# Patient Record
Sex: Female | Born: 1995 | Hispanic: Refuse to answer | Marital: Single | Smoking: Never smoker
Health system: Southern US, Community
[De-identification: ages and names within clinical notes are randomized; demographics above are authoritative.]

## PROBLEM LIST (undated history)

## (undated) DIAGNOSIS — F419 Anxiety disorder, unspecified: Secondary | ICD-10-CM

## (undated) DIAGNOSIS — R620 Delayed milestone in childhood: Secondary | ICD-10-CM

## (undated) HISTORY — DX: Anxiety disorder, unspecified: F41.9

---
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
## 2017-09-07 ENCOUNTER — Encounter: Payer: Self-pay | Admitting: Gastroenterology

## 2017-10-06 ENCOUNTER — Encounter: Payer: Self-pay | Admitting: Gastroenterology

## 2017-10-06 ENCOUNTER — Ambulatory Visit (INDEPENDENT_AMBULATORY_CARE_PROVIDER_SITE_OTHER): Payer: Medicaid Other | Admitting: Gastroenterology

## 2017-10-06 VITALS — BP 110/72 | HR 72 | Ht 64.5 in | Wt 167.6 lb

## 2017-10-06 DIAGNOSIS — R05 Cough: Secondary | ICD-10-CM | POA: Insufficient documentation

## 2017-10-06 DIAGNOSIS — R1310 Dysphagia, unspecified: Secondary | ICD-10-CM | POA: Diagnosis not present

## 2017-10-06 DIAGNOSIS — R1030 Lower abdominal pain, unspecified: Secondary | ICD-10-CM | POA: Insufficient documentation

## 2017-10-06 DIAGNOSIS — K219 Gastro-esophageal reflux disease without esophagitis: Secondary | ICD-10-CM | POA: Diagnosis not present

## 2017-10-06 DIAGNOSIS — R053 Chronic cough: Secondary | ICD-10-CM | POA: Insufficient documentation

## 2017-10-06 DIAGNOSIS — R197 Diarrhea, unspecified: Secondary | ICD-10-CM

## 2017-10-06 MED ORDER — DICYCLOMINE HCL 20 MG PO TABS: 20.0000 mg | ORAL_TABLET | Freq: Two times a day (BID) | ORAL | 2 refills | Status: DC

## 2017-10-06 MED ORDER — NA SULFATE-K SULFATE-MG SULF 17.5-3.13-1.6 GM/177ML PO SOLN: ORAL | 0 refills | Status: DC

## 2017-10-06 MED ORDER — DEXLANSOPRAZOLE 30 MG PO CPDR: 30.0000 mg | DELAYED_RELEASE_CAPSULE | Freq: Every day | ORAL | 5 refills | Status: DC

## 2017-10-06 NOTE — Patient Instructions (Signed)
If you are age 24 or older, your body mass index should be between 23-30. Your Body mass index is 28.32 kg/m. If this is out of the aforementioned range listed, please consider follow up with your Primary Care Provider.  If you are age 24 or younger, your body mass index should be between 19-25. Your Body mass index is 28.32 kg/m. If this is out of the aformentioned range listed, please consider follow up with your Primary Care Provider.   You have been scheduled for an endoscopy and colonoscopy. Please follow the written instructions given to you at your visit today. Please pick up your prep supplies at the pharmacy within the next 1-3 days. If you use inhalers (even only as needed), please bring them with you on the day of your procedure. Your physician has requested that you go to www.startemmi.com and enter the access code given to you at your visit today. This web site gives a general overview about your procedure. However, you should still follow specific instructions given to you by our office regarding your preparation for the procedure.  We have sent the following medications to your pharmacy for you to pick up at your convenience: Suprep Dexilant Bentyl  Thank you for choosing me and [facility].   Doug SouJessica Zehr, PA-C

## 2017-10-06 NOTE — Progress Notes (Addendum)
10/06/2017 Tracey Griffin [id number] 08-11-1995   HISTORY OF PRESENT ILLNESS:  This is a 22 year old female with learning disability/mental developmental delay who is here with several GI complaints.  It is very hard to obtain history from the patient as she is not able to describe her symptoms well.  Her mother is with her, but her mother is also a poor historian and has a difficult time relaying information.  It is really hard to know what is going on with her and what her symptoms really are and what they represent.    Anyway, they present here today with complaints of persistent heartburn and discomfort in her chest related to that as well as intermittent difficulty swallowing.  They say that this occurs mostly with food.  She apparently complains that it hurts to swallow at times.  It appears that she's had this complaint dating back to 2012 and that she actually had a swallow evaluation performed that had been ordered by her pediatrician, and appears that that study was normal.  They also report a chronic dry cough.  Initially she was given Singulair which they thought worked great for a while and then she started back with cough again.  She is on Dexilant 30 mg at bedtime and takes Zantac liquid as needed.  Apparently she takes her Dexilant at night because her coughing was worse at night and apparently that has helped with her nocturnal coughing symptoms.  Hpylori stool Ag is negative.  They also report issues with alternating bowel habits, but seem to focus more on the diarrhea.  Her mother says that she is in the bathroom several times per day.  She says that she has diarrhea, but then she will make comments to her mother about how sometimes it is hard to get her stool out.  She complains of lower abdominal pain that she says occurs mostly at night and sometimes after eating.  They stated that the diarrhea and lower abdominal pain have been present for the past 9 months or so.  Her PCP does have  her on Bentyl 20 mg that she uses on occasion, but they said that they thought it made her diarrhea worse.  Her PCP checked her for celiac disease and that was negative.  They had also recommended that she try Metamucil daily and her mother said that they did that for a period of time but they did not feel like it helped.  She denies seeing blood in her stool.  Referred here by her PCP, Ralene OkSarah Gordon, PA-C.    Past Medical History:  Diagnosis Date  . Anxiety    The histories are not reviewed yet. Please review them in the "History" navigator section and refresh this SmartLink.  reports that she has never smoked. She does not have any smokeless tobacco history on file. She reports that she does not drink alcohol or use drugs. family history is not on file. No Known Allergies    Outpatient Encounter Medications as of 10/06/2017  Medication Sig  . cetirizine (ZYRTEC) 10 MG chewable tablet Chew 10 mg by mouth daily.  Marland Kitchen. Dexlansoprazole (DEXILANT) 30 MG capsule Take 30 mg by mouth at bedtime.  . dicyclomine (BENTYL) 20 MG tablet Take 20 mg by mouth every 6 (six) hours. Take as needed  . montelukast (SINGULAIR) 10 MG tablet Take 10 mg by mouth at bedtime.  Kathrynn Running. Norethin-Eth Estradiol-Fe [facility](WYMZYA FE) 0.4-35 MG-MCG tablet Chew 1 tablet by mouth at bedtime as  needed.  . ranitidine (ZANTAC) 15 MG/ML syrup Take 2 mg/kg/day by mouth as needed for heartburn.   No facility-administered encounter medications on file as of 10/06/2017.      REVIEW OF SYSTEMS  : All other systems reviewed and negative except where noted in the History of Present Illness.   PHYSICAL EXAM: BP 110/72   Pulse 72   Ht 5' 4.5" (1.638 m)   Wt 167 lb 9.6 oz (76 kg)   LMP 10/04/2017   BMI 28.32 kg/m  General: Well developed white female in no acute distress Head: Normocephalic and atraumatic Eyes:  Sclerae anicteric, conjunctiva pink. Ears: Normal auditory acuity Lungs: Clear throughout to auscultation; no increased  WOB. Heart: Regular rate and rhythm; no M/R/G. Abdomen: Soft, non-distended.  BS present.  Mild lower abdominal TTP. Rectal:  Will be done at the time of colonoscopy. Musculoskeletal: Symmetrical with no gross deformities  Skin: No lesions on visible extremities Extremities: No edema  Neurological: Alert oriented x 4, grossly non-focal Psychological:  Alert and cooperative. Normal mood and affect  ASSESSMENT AND PLAN: 22 year old female with learning disability/mental developmental delay who is here with several GI complaints.  It is very hard to obtain history from the patient as she is not able to describe her symptoms well.  Her mother is with her, but her mother is also a poor historian and has a difficult time relaying information.  It is really hard to know what is going on with her and what her symptoms are and what they represent.  *GERD, chronic cough, dysphagia: We will schedule for EGD with possible dilation with Dr. Rhea BeltonPyrtle.  She is on Dexilant 30 mg daily and I am going to increase that to 60 mg daily. *Diarrhea and lower abdominal pain:  Will schedule for colonoscopy to rule out IBD.  She already has Bentyl at home and has been using prn.  I will give a new prescription and would like her to begin taking it BID.  **The risks, benefits, and alternatives to EGD and colonoscopy were discussed with the patient and she consents to proceed.   CC:  Powers, Shireen QuanStephanie J, MD   Addendum: Reviewed and agree with initial management. Pyrtle, Carie CaddyJay M, MD

## 2017-11-16 ENCOUNTER — Encounter: Payer: Self-pay | Admitting: Internal Medicine

## 2017-11-16 ENCOUNTER — Ambulatory Visit (AMBULATORY_SURGERY_CENTER): Payer: Medicaid Other | Admitting: Internal Medicine

## 2017-11-16 VITALS — BP 104/79 | HR 82 | Temp 98.7°F | Resp 17 | Ht 64.0 in | Wt 167.0 lb

## 2017-11-16 DIAGNOSIS — R0789 Other chest pain: Secondary | ICD-10-CM

## 2017-11-16 DIAGNOSIS — K2950 Unspecified chronic gastritis without bleeding: Secondary | ICD-10-CM | POA: Diagnosis not present

## 2017-11-16 DIAGNOSIS — R197 Diarrhea, unspecified: Secondary | ICD-10-CM

## 2017-11-16 DIAGNOSIS — R1084 Generalized abdominal pain: Secondary | ICD-10-CM

## 2017-11-16 DIAGNOSIS — R198 Other specified symptoms and signs involving the digestive system and abdomen: Secondary | ICD-10-CM

## 2017-11-16 DIAGNOSIS — K219 Gastro-esophageal reflux disease without esophagitis: Secondary | ICD-10-CM | POA: Diagnosis not present

## 2017-11-16 MED ORDER — SODIUM CHLORIDE 0.9 % IV SOLN: 500.0000 mL | Freq: Once | INTRAVENOUS | Status: AC

## 2017-11-16 MED ORDER — DEXLANSOPRAZOLE 60 MG PO CPDR: 60.0000 mg | DELAYED_RELEASE_CAPSULE | Freq: Every day | ORAL | 11 refills | Status: DC

## 2017-11-16 NOTE — Op Note (Signed)
[facility] Patient Name: Quetzal Meany Procedure Date: 11/16/2017 3:28 PM MRN: [id number] Endoscopist: Beverley Fiedler , MD Age: 22 Referring MD:  Date of Birth: 16-Jun-1995 Gender: Female Account #: [id number] Procedure:                Upper GI endoscopy Indications:              Suspected gastro-esophageal reflux disease,                            Unexplained chest pain, intermittent diarrhea Medicines:                Monitored Anesthesia Care Procedure:                Pre-Anesthesia Assessment:                           - Prior to the procedure, a History and Physical                            was performed, and patient medications and                            allergies were reviewed. The patient's tolerance of                            previous anesthesia was also reviewed. The risks                            and benefits of the procedure and the sedation                            options and risks were discussed with the patient.                            All questions were answered, and informed consent                            was obtained. Prior Anticoagulants: The patient has                            taken no previous anticoagulant or antiplatelet                            agents. ASA Grade Assessment: II - A patient with                            mild systemic disease. After reviewing the risks                            and benefits, the patient was deemed in                            satisfactory condition to undergo the procedure.  After obtaining informed consent, the endoscope was                            passed under direct vision. Throughout the                            procedure, the patient's blood pressure, pulse, and                            oxygen saturations were monitored continuously. The                            Endoscope was introduced through the mouth, and                            advanced to the second  part of duodenum. The upper                            GI endoscopy was accomplished without difficulty.                            The patient tolerated the procedure well. Scope In: Scope Out: Findings:                 The Z-line was irregular and was found 38 cm from                            the incisors (one tongue of salmon-colored mucosa).                            This was biopsied with a cold forceps for                            evaluation to rule out Barrett's Esophagus.                           The exam of the esophagus was otherwise normal.                           The entire examined stomach was normal. Biopsies                            were taken with a cold forceps for histology and                            Helicobacter pylori testing.                           The examined duodenum was normal. Complications:            No immediate complications. Estimated Blood Loss:     Estimated blood loss was minimal. Impression:               - Z-line irregular, 38 cm from the incisors.  Biopsied.                           - Normal stomach. Biopsied.                           - Normal examined duodenum. Recommendation:           - Patient has a contact number available for                            emergencies. The signs and symptoms of potential                            delayed complications were discussed with the                            patient. Return to normal activities tomorrow.                            Written discharge instructions were provided to the                            patient.                           - Resume previous diet.                           - Continue present medications.                           - Await pathology results. Beverley Fiedler, MD 11/16/2017 4:22:25 PM This report has been signed electronically.

## 2017-11-16 NOTE — Progress Notes (Signed)
Called to room to assist during endoscopic procedure.  Patient ID and intended procedure confirmed with present staff. Received instructions for my participation in the procedure from the performing physician.  

## 2017-11-16 NOTE — Op Note (Signed)
[facility] Patient Name: Tracey Griffin Procedure Date: 11/16/2017 3:28 PM MRN: [id number] Endoscopist: Beverley Fiedler , MD Age: 22 Referring MD:  Date of Birth: 1995-02-24 Gender: Female Account #: [id number] Procedure:                Colonoscopy Indications:              Generalized abdominal pain, Diarrhea, alternating                            bowel habits Medicines:                Monitored Anesthesia Care Procedure:                Pre-Anesthesia Assessment:                           - Prior to the procedure, a History and Physical                            was performed, and patient medications and                            allergies were reviewed. The patient's tolerance of                            previous anesthesia was also reviewed. The risks                            and benefits of the procedure and the sedation                            options and risks were discussed with the patient.                            All questions were answered, and informed consent                            was obtained. Prior Anticoagulants: The patient has                            taken no previous anticoagulant or antiplatelet                            agents. ASA Grade Assessment: II - A patient with                            mild systemic disease. After reviewing the risks                            and benefits, the patient was deemed in                            satisfactory condition to undergo the procedure.  After obtaining informed consent, the colonoscope                            was passed under direct vision. Throughout the                            procedure, the patient's blood pressure, pulse, and                            oxygen saturations were monitored continuously. The                            Model PCF-[id number]) scope was introduced                            through the anus and advanced to the cecum,                      identified by appendiceal orifice and ileocecal                            valve. The colonoscopy was performed without                            difficulty. The patient tolerated the procedure                            well. The quality of the bowel preparation was poor                            in the cecum, descending colon and proximal                            transverse colon. The ileocecal valve, appendiceal                            orifice, and rectum were photographed. Scope In: 3:54:20 PM Scope Out: 4:06:05 PM Scope Withdrawal Time: 0 hours 8 minutes 11 seconds  Total Procedure Duration: 0 hours 11 minutes 45 seconds  Findings:                 Stool, thick and adherent, was found in the                            proximal transverse colon, in the ascending colon                            and in the cecum, making visualization difficult.                            Copious irrigation lavage performed and in the                            mucosa that was revealed there was no evidence of  inflammation. No masses seen.                           Otherwise normal mucosa was found in the entire                            colon. No evidence of inflammation, diverticulosis,                            polypoid lesions.                           The retroflexed view of the distal rectum and anal                            verge was normal and showed no anal or rectal                            abnormalities. Complications:            No immediate complications. Estimated Blood Loss:     Estimated blood loss: none. Impression:               - Preparation of the colon was poor in the right                            colon.                           - Otherwise normal mucosa in the entire examined                            colon.                           - The distal rectum and anal verge are normal on                             retroflexion view.                           - No specimens collected. Recommendation:           - Patient has a contact number available for                            emergencies. The signs and symptoms of potential                            delayed complications were discussed with the                            patient. Return to normal activities tomorrow.                            Written discharge instructions were provided to the  patient.                           - Resume previous diet.                           - Continue present medications.                           - Would recommend adding Benefiber 1 to 2                            tablespoons daily to help regulate bowel function.                           - Repeat colonoscopy for screening purposes at age                            27. Beverley Fiedler, MD 11/16/2017 4:27:21 PM This report has been signed electronically.

## 2017-11-16 NOTE — Patient Instructions (Addendum)
YOU HAD AN ENDOSCOPIC PROCEDURE TODAY AT THE [facility]:   Refer to the procedure report that was given to you for any specific questions about what was found during the examination.  If the procedure report does not answer your questions, please call your gastroenterologist to clarify.  If you requested that your care partner not be given the details of your procedure findings, then the procedure report has been included in a sealed envelope for you to review at your convenience later.  YOU SHOULD EXPECT: Some feelings of bloating in the abdomen. Passage of more gas than usual.  Walking can help get rid of the air that was put into your GI tract during the procedure and reduce the bloating. If you had a lower endoscopy (such as a colonoscopy or flexible sigmoidoscopy) you may notice spotting of blood in your stool or on the toilet paper. If you underwent a bowel prep for your procedure, you may not have a normal bowel movement for a few days.  Please Note:  You might notice some irritation and congestion in your nose or some drainage.  This is from the oxygen used during your procedure.  There is no need for concern and it should clear up in a day or so.  SYMPTOMS TO REPORT IMMEDIATELY:   Following lower endoscopy (colonoscopy or flexible sigmoidoscopy):  Excessive amounts of blood in the stool  Significant tenderness or worsening of abdominal pains  Swelling of the abdomen that is new, acute  Fever of 100F or higher   Following upper endoscopy (EGD)  Vomiting of blood or coffee ground material  New chest pain or pain under the shoulder blades  Painful or persistently difficult swallowing  New shortness of breath  Fever of 100F or higher  Black, tarry-looking stools  For urgent or emergent issues, a gastroenterologist can be reached at any hour by calling [phone].   DIET:  We do recommend a small meal at first, but then you may proceed to your regular diet.  Drink  plenty of fluids but you should avoid alcoholic beverages for 24 hours.  MEDICATIONS: Continue present medications. Dr. Rhea Belton recommends adding Benefiber 1 to 2 tablespoons daily to help regulate bowel function. Increase Dexilant to 60 mg by mouth daily. This prescription has been electronically sent to your pharmacy of choice.  Please see handouts given to you by your recovery nurse.  ACTIVITY:  You should plan to take it easy for the rest of today and you should NOT DRIVE or use heavy machinery until tomorrow (because of the sedation medicines used during the test).    FOLLOW UP: Our staff will call the number listed on your records the next business day following your procedure to check on you and address any questions or concerns that you may have regarding the information given to you following your procedure. If we do not reach you, we will leave a message.  However, if you are feeling well and you are not experiencing any problems, there is no need to return our call.  We will assume that you have returned to your regular daily activities without incident.  If any biopsies were taken you will be contacted by phone or by letter within the next 1-3 weeks.  Please call us at [phone] if you have not heard about the biopsies in 3 weeks.   Thank you for allowing [place] to provide for your healthcare needs today.  SIGNATURES/CONFIDENTIALITY: You and/or your care partner have  signed paperwork which will be entered into your electronic medical record.  These signatures attest to the fact that that the information above on your After Visit Summary has been reviewed and is understood.  Full responsibility of the confidentiality of this discharge information lies with you and/or your care-partner.

## 2017-11-16 NOTE — Progress Notes (Signed)
A and O x3. Report to RN. Tolerated MAC anesthesia well.Teeth unchanged after procedure.

## 2017-11-17 ENCOUNTER — Telehealth: Payer: Self-pay | Admitting: *Deleted

## 2017-11-17 ENCOUNTER — Telehealth: Payer: Self-pay | Admitting: Internal Medicine

## 2017-11-17 NOTE — Telephone Encounter (Signed)
First follow up call attempt.  Voicemail with phone number identifier.  Message left to call if any questions or concerns. 

## 2017-11-17 NOTE — Telephone Encounter (Signed)
Patients mother calling to state medication dexilant was not at [facility] and would like it resent.

## 2017-11-17 NOTE — Telephone Encounter (Signed)
Mom Tracey Griffin says she is having back pain and that was one of the things they told her to call back about.

## 2017-11-17 NOTE — Telephone Encounter (Signed)
Patient mother calling back stating they now have medication and does not need it resent.

## 2017-11-17 NOTE — Telephone Encounter (Signed)
  Follow up Call-  Call back number 11/16/2017  Post procedure Call Back phone  # [phone]  Permission to leave phone message Yes  Some recent data might be hidden     Patient questions:  Do you have a fever, pain , or abdominal swelling? No. Pain Score  0 *  Have you tolerated food without any problems? Yes.    Have you been able to return to your normal activities? Yes.    Do you have any questions about your discharge instructions: Diet   No. Medications  No. Follow up visit  No.  Do you have questions or concerns about your Care? No.  Actions: * If pain score is 4 or above: No action needed, pain <4.  Mom reports mild soreness in upper back, Not causing distress.  Advised to take some Tylenol today according to instructions and rest today.  Advised to call back if doesn't improve next 24 hours or worsens.

## 2017-11-28 ENCOUNTER — Encounter: Payer: Self-pay | Admitting: Internal Medicine

## 2018-11-11 ENCOUNTER — Other Ambulatory Visit: Payer: Self-pay | Admitting: Internal Medicine

## 2021-06-12 ENCOUNTER — Emergency Department (HOSPITAL_COMMUNITY)
Admission: EM | Admit: 2021-06-12 | Discharge: 2021-06-12 | Disposition: A | Discharge status: Home / Self Care | Payer: Medicaid Other | Attending: Emergency Medicine | Admitting: Emergency Medicine

## 2021-06-12 ENCOUNTER — Encounter (HOSPITAL_COMMUNITY): Payer: Self-pay

## 2021-06-12 ENCOUNTER — Other Ambulatory Visit: Payer: Self-pay

## 2021-06-12 ENCOUNTER — Emergency Department (HOSPITAL_COMMUNITY): Payer: Medicaid Other

## 2021-06-12 DIAGNOSIS — R0789 Other chest pain: Secondary | ICD-10-CM | POA: Insufficient documentation

## 2021-06-12 DIAGNOSIS — G4486 Cervicogenic headache: Secondary | ICD-10-CM | POA: Diagnosis not present

## 2021-06-12 DIAGNOSIS — R079 Chest pain, unspecified: Secondary | ICD-10-CM

## 2021-06-12 HISTORY — DX: Delayed milestone in childhood: R62.0

## 2021-06-12 LAB — BASIC METABOLIC PANEL
Anion gap: 10 (ref 5–15)
BUN: 10 mg/dL (ref 6–20)
CO2: 21 mmol/L — ABNORMAL LOW (ref 22–32)
Calcium: 9.4 mg/dL (ref 8.9–10.3)
Chloride: 107 mmol/L (ref 98–111)
Creatinine, Ser: 0.52 mg/dL (ref 0.44–1.00)
GFR, Estimated: >60 mL/min (ref >60)
Glucose, Bld: 95 mg/dL (ref 70–99)
Potassium: 4 mmol/L (ref 3.5–5.1)
Sodium: 138 mmol/L (ref 135–145)

## 2021-06-12 LAB — CBC
HCT: 40.2 % (ref 36.0–46.0)
Hemoglobin: 13.6 g/dL (ref 12.0–15.0)
MCH: 30.2 pg (ref 26.0–34.0)
MCHC: 33.8 g/dL (ref 30.0–36.0)
MCV: 89.1 fL (ref 80.0–100.0)
Platelets: 392 10*3/uL (ref 150–400)
RBC: 4.51 MIL/uL (ref 3.87–5.11)
RDW: 12 % (ref 11.5–15.5)
WBC: 8.4 10*3/uL (ref 4.0–10.5)
nRBC: 0 % (ref 0.0–0.2)

## 2021-06-12 LAB — TROPONIN I (HIGH SENSITIVITY): Troponin I (High Sensitivity): <2 ng/L (ref <18)

## 2021-06-12 LAB — I-STAT BETA HCG BLOOD, ED (MC, WL, AP ONLY): I-stat hCG, quantitative: <5 m[IU]/mL (ref <5)

## 2021-06-12 MED ORDER — ACETAMINOPHEN 500 MG PO TABS
1000.0000 mg | ORAL_TABLET | Freq: Once | ORAL | Status: AC
  Administered 2021-06-12: 1000 mg via ORAL
  Filled 2021-06-12: qty 2

## 2021-06-12 MED ORDER — METOCLOPRAMIDE HCL 10 MG PO TABS: 10.0000 mg | ORAL_TABLET | Freq: Four times a day (QID) | ORAL | 0 refills | Status: DC

## 2021-06-12 NOTE — ED Provider Notes (Signed)
?[facility] EMERGENCY DEPARTMENT ?Provider Note ? ? ?CSN: [id number] ?Arrival date & time: 06/12/21  1226 ? ?  ? ?History ? ?Chief Complaint  ?Patient presents with  ? Chest Pain  ? ? ?Tracey Griffin is a 26 y.o. female. ? ? ?Chest Pain ? ?Patient is a 26 year old female with a past medical history significant for developmental delay, reflux, chronic cough, anxiety ? ?She is present with her mother at bedside today.  She states that she woke up this morning around 6 AM with a chest pain that is achy in her chest.  She points to her mid sternum when she describes this.  She states she has had no nausea vomiting shortness of breath lightheadedness cough congestion fevers chills denies any abdominal pain.  Denies any other associate symptoms.  She states it only hurts when she stands a she states that it does not get worse when she exerts herself or walks upstairs. ? ?Her mother states that she is also complained of some headaches over the past few days.  Seems that she frequently gets headaches seems that they are bandlike in quality and seem to wrap around her head she seems to have some neck pain and shoulder aches but generally affect her.  Her mother states that she has done some massage which seems to help but only temporarily. ? ?She denies any numbness weakness slurred speech confusion head trauma or neck trauma ? ?  ? ?Home Medications ?Prior to Admission medications   ?Medication Sig Start Date End Date Taking? Authorizing Provider  ?metoCLOPramide (REGLAN) 10 MG tablet Take 1 tablet (10 mg total) by mouth every 6 (six) hours. 06/12/21  Yes Gailen Shelter, PA  ?cetirizine (ZYRTEC) 10 MG chewable tablet Chew 10 mg by mouth daily.    [provider]  ?DEXILANT 60 MG capsule TAKE 1 CAPSULE(60 MG) BY MOUTH DAILY 11/13/18   Pyrtle, Carie Caddy, MD  ?dicyclomine (BENTYL) 20 MG tablet Take 1 tablet (20 mg total) by mouth 2 (two) times daily. Take as needed ?Patient not taking: Reported on 11/16/2017  10/06/17   Zehr, Shanda Bumps D, PA-C  ?montelukast (SINGULAIR) 10 MG tablet Take 10 mg by mouth at bedtime.    [provider]  ?Darrol Angel [facility] FE) 0.4-35 MG-MCG tablet Chew 1 tablet by mouth at bedtime as needed.    [provider]  ?ranitidine (ZANTAC) 15 MG/ML syrup Take 2 mg/kg/day by mouth as needed for heartburn.    [provider]  ?   ? ?Allergies    ?Patient has no known allergies.   ? ?Review of Systems   ?Review of Systems  ?Cardiovascular:  Positive for chest pain.  ? ?Physical Exam ?Updated Vital Signs ?BP 129/65   Pulse 84   Temp 98.4 ?F (36.9 ?C) (Oral)   Resp 19   Ht 5\' 4"  (1.626 m)   Wt 75.8 kg   LMP 06/12/2021 (Approximate)   SpO2 98%   BMI 28.68 kg/m?  ?Physical Exam ?Vitals and nursing note reviewed.  ?Constitutional:   ?   General: She is not in acute distress. ?   Comments: Pleasant well-appearing 26 year old.  In no acute distress.  Sitting comfortably in bed.  Able answer questions appropriately follow commands. No increased work of breathing. Speaking in full sentences.   ?HENT:  ?   Head: Normocephalic and atraumatic.  ?   Nose: Nose normal.  ?   Mouth/Throat:  ?   Mouth: Mucous membranes are moist.  ?Eyes:  ?  General: No scleral icterus. ?Cardiovascular:  ?   Rate and Rhythm: Normal rate and regular rhythm.  ?   Pulses: Normal pulses.  ?   Heart sounds: Normal heart sounds.  ?Pulmonary:  ?   Effort: Pulmonary effort is normal. No respiratory distress.  ?   Breath sounds: Normal breath sounds. No wheezing.  ?   Comments: ? TTP of anterior chest ?Chest:  ?   Chest wall: Tenderness present.  ?Abdominal:  ?   Palpations: Abdomen is soft.  ?   Tenderness: There is no abdominal tenderness. There is no guarding or rebound.  ?Musculoskeletal:  ?   Cervical back: Normal range of motion.  ?   Right lower leg: No edema.  ?   Left lower leg: No edema.  ?Skin: ?   General: Skin is warm and dry.  ?   Capillary Refill: Capillary refill takes less than 2  seconds.  ?Neurological:  ?   Mental Status: She is alert. Mental status is at baseline.  ?Psychiatric:     ?   Mood and Affect: Mood normal.     ?   Behavior: Behavior normal.  ? ? ?ED Results / Procedures / Treatments   ?Labs ?(all labs ordered are listed, but only abnormal results are displayed) ?Labs Reviewed  ?BASIC METABOLIC PANEL - Abnormal; Notable for the following components:  ?    Result Value  ? CO2 21 (*)   ? All other components within normal limits  ?CBC  ?I-STAT BETA HCG BLOOD, ED ([facility], [facility], [facility] ONLY)  ?TROPONIN I (HIGH SENSITIVITY)  ? ? ?EKG ?None ? ?Radiology ?DG Chest 2 View ? ?Result Date: 06/12/2021 ?CLINICAL DATA:  Chest pain. EXAM: CHEST - 2 VIEW COMPARISON:  None. FINDINGS: Low lung volumes. No consolidation. No visible pleural effusions or pneumothorax. Cardiomediastinal silhouette is within normal limits. IMPRESSION: Low lung volumes without evidence of acute cardiopulmonary disease. Electronically Signed   By: Feliberto HartsFrederick S Jones M.D.   On: 06/12/2021 13:02   ? ?Procedures ?Procedures  ? ? ?Medications Ordered in ED ?Medications  ?acetaminophen (TYLENOL) tablet 1,000 mg (has no administration in time range)  ? ? ?ED Course/ Medical Decision Making/ A&P ?  ?                        ?Medical Decision Making ?Amount and/or Complexity of Data Reviewed ?Labs: ordered. ?Radiology: ordered. ? ?Risk ?OTC drugs. ?Prescription drug management. ? ? ?This patient presents to the ED for concern of chest pain, this involves a number of treatment options, and is a complaint that carries with it a moderate to high risk of complications and morbidity.  The differential diagnosis includes The emergent causes of chest pain include: Acute coronary syndrome, tamponade, pericarditis/myocarditis, aortic dissection, pulmonary embolism, tension pneumothorax, pneumonia, and esophageal rupture. ? ? ? ? ? ?Co morbidities: ?Discussed in HPI ? ? ?Brief History: ? ?Patient is a 26 year old female with a past medical history  significant for developmental delay, reflux, chronic cough, anxiety ? ?She is present with her mother at bedside today.  She states that she woke up this morning around 6 AM with a chest pain that is achy in her chest.  She points to her mid sternum when she describes this.  She states she has had no nausea vomiting shortness of breath lightheadedness cough congestion fevers chills denies any abdominal pain.  Denies any other associate symptoms.  She states it only hurts when she stands a  she states that it does not get worse when she exerts herself or walks upstairs. ? ?Her mother states that she is also complained of some headaches over the past few days.  Seems that she frequently gets headaches seems that they are bandlike in quality and seem to wrap around her head she seems to have some neck pain and shoulder aches but generally affect her.  Her mother states that she has done some massage which seems to help but only temporarily. ? ?She denies any numbness weakness slurred speech confusion head trauma or neck trauma ? ? ? ?I have a very low suspicion for emergent cause of patient's symptoms today ? ?I do not believe the patient has an emergent cause of chest pain, other urgent/non-acute considerations include, but are not limited to: chronic angina, aortic stenosis, cardiomyopathy, mitral valve prolapse, pulmonary hypertension, aortic insufficiency, right ventricular hypertrophy, pleuritis, bronchitis, pneumothorax, tumor, gastroesophageal reflux disease (GERD), esophageal spasm, Mallory-Weiss syndrome, peptic ulcer disease, pancreatitis, functional gastrointestinal pain, cervical or thoracic disk disease or arthritis, shoulder arthritis, costochondritis, subacromial bursitis, anxiety or panic attack, herpes zoster, breast disorders, chest wall tumors, thoracic outlet syndrome, mediastinitis. ? ? ?EMR reviewed including pt PMHx, past surgical history and past visits to ER.  ? ?See HPI for more  details ? ? ?Lab Tests: ? ? ?I ordered and independently interpreted labs. Labs notable for chest pain since 6 AM this morning with negative troponin here in the ER ? ?I stated she is negative for pregnancy. ? ?I personally rev

## 2021-06-12 NOTE — ED Triage Notes (Addendum)
Pt c/o non radiating mid sternal chest pain that started this morning. Pt denies N/V, SOB. Pt has been hypertensivex3 days. Pt denies dizziness. Pt c/o HA. Per mom, the pt's chest pain worsens upon standing. Per mom, the pt is developmentally delayed. Pt does not appear to be in distress.   ?

## 2021-06-12 NOTE — Discharge Instructions (Addendum)
Your work-up today was quite reassuring.  Although I did not find a specific cause of your chest pain I think it is very reasonable to follow-up with your primary care provider.  You may certainly return the emergency room if you have any new or concerning symptoms or for reassessment however I would recommend taking Tylenol and ibuprofen for pain and doing some massage on the back of your neck to help with the headaches you are having. ? ?I have written you a prescription for Reglan which is a medicine that helps with headaches take this with the 25 mg Benadryl ?

## 2021-06-17 ENCOUNTER — Emergency Department (HOSPITAL_COMMUNITY)
Admission: EM | Admit: 2021-06-17 | Discharge: 2021-06-17 | Discharge status: Against Medical Advice | Payer: Medicaid Other | Attending: Emergency Medicine | Admitting: Emergency Medicine

## 2021-06-17 ENCOUNTER — Other Ambulatory Visit: Payer: Self-pay

## 2021-06-17 ENCOUNTER — Encounter (HOSPITAL_COMMUNITY): Payer: Self-pay

## 2021-06-17 ENCOUNTER — Emergency Department (HOSPITAL_COMMUNITY): Payer: Medicaid Other

## 2021-06-17 DIAGNOSIS — R350 Frequency of micturition: Secondary | ICD-10-CM | POA: Insufficient documentation

## 2021-06-17 DIAGNOSIS — R197 Diarrhea, unspecified: Secondary | ICD-10-CM | POA: Diagnosis not present

## 2021-06-17 DIAGNOSIS — R1013 Epigastric pain: Secondary | ICD-10-CM | POA: Insufficient documentation

## 2021-06-17 DIAGNOSIS — R0789 Other chest pain: Secondary | ICD-10-CM

## 2021-06-17 DIAGNOSIS — R072 Precordial pain: Secondary | ICD-10-CM | POA: Insufficient documentation

## 2021-06-17 DIAGNOSIS — D72829 Elevated white blood cell count, unspecified: Secondary | ICD-10-CM | POA: Insufficient documentation

## 2021-06-17 DIAGNOSIS — Z5321 Procedure and treatment not carried out due to patient leaving prior to being seen by health care provider: Secondary | ICD-10-CM | POA: Insufficient documentation

## 2021-06-17 DIAGNOSIS — R300 Dysuria: Secondary | ICD-10-CM | POA: Diagnosis not present

## 2021-06-17 LAB — BASIC METABOLIC PANEL
Anion gap: 11 (ref 5–15)
BUN: 8 mg/dL (ref 6–20)
CO2: 20 mmol/L — ABNORMAL LOW (ref 22–32)
Calcium: 9.3 mg/dL (ref 8.9–10.3)
Chloride: 105 mmol/L (ref 98–111)
Creatinine, Ser: 0.44 mg/dL (ref 0.44–1.00)
GFR, Estimated: >60 mL/min (ref >60)
Glucose, Bld: 163 mg/dL — ABNORMAL HIGH (ref 70–99)
Potassium: 4 mmol/L (ref 3.5–5.1)
Sodium: 136 mmol/L (ref 135–145)

## 2021-06-17 LAB — CBC WITH DIFFERENTIAL/PLATELET
Abs Immature Granulocytes: 0.03 10*3/uL (ref 0.00–0.07)
Basophils Absolute: 0.1 10*3/uL (ref 0.0–0.1)
Basophils Relative: 1 %
Eosinophils Absolute: 0.3 10*3/uL (ref 0.0–0.5)
Eosinophils Relative: 3 %
HCT: 39.5 % (ref 36.0–46.0)
Hemoglobin: 13.3 g/dL (ref 12.0–15.0)
Immature Granulocytes: 0 %
Lymphocytes Relative: 26 %
Lymphs Abs: 2.8 10*3/uL (ref 0.7–4.0)
MCH: 30.1 pg (ref 26.0–34.0)
MCHC: 33.7 g/dL (ref 30.0–36.0)
MCV: 89.4 fL (ref 80.0–100.0)
Monocytes Absolute: 0.4 10*3/uL (ref 0.1–1.0)
Monocytes Relative: 4 %
Neutro Abs: 7.3 10*3/uL (ref 1.7–7.7)
Neutrophils Relative %: 66 %
Platelets: 422 10*3/uL — ABNORMAL HIGH (ref 150–400)
RBC: 4.42 MIL/uL (ref 3.87–5.11)
RDW: 11.8 % (ref 11.5–15.5)
WBC: 10.9 10*3/uL — ABNORMAL HIGH (ref 4.0–10.5)
nRBC: 0 % (ref 0.0–0.2)

## 2021-06-17 LAB — HEPATIC FUNCTION PANEL
ALT: 17 U/L (ref 0–44)
AST: 22 U/L (ref 15–41)
Albumin: 3.8 g/dL (ref 3.5–5.0)
Alkaline Phosphatase: 45 U/L (ref 38–126)
Bilirubin, Direct: <0.1 mg/dL (ref 0.0–0.2)
Total Bilirubin: 0.5 mg/dL (ref 0.3–1.2)
Total Protein: 7.5 g/dL (ref 6.5–8.1)

## 2021-06-17 LAB — I-STAT BETA HCG BLOOD, ED (MC, WL, AP ONLY): I-stat hCG, quantitative: <5 m[IU]/mL (ref <5)

## 2021-06-17 LAB — URINALYSIS, ROUTINE W REFLEX MICROSCOPIC
Bilirubin Urine: NEGATIVE
Glucose, UA: NEGATIVE mg/dL
Hgb urine dipstick: NEGATIVE
Ketones, ur: NEGATIVE mg/dL
Leukocytes,Ua: NEGATIVE
Nitrite: NEGATIVE
Protein, ur: NEGATIVE mg/dL
Specific Gravity, Urine: 1.027 (ref 1.005–1.030)
pH: 5 (ref 5.0–8.0)

## 2021-06-17 LAB — D-DIMER, QUANTITATIVE: D-Dimer, Quant: <0.27 ug/mL-FEU (ref 0.00–0.50)

## 2021-06-17 LAB — LIPASE, BLOOD: Lipase: 35 U/L (ref 11–51)

## 2021-06-17 LAB — TROPONIN I (HIGH SENSITIVITY): Troponin I (High Sensitivity): 2 ng/L (ref <18)

## 2021-06-17 NOTE — ED Provider Triage Note (Signed)
Emergency Medicine Provider Triage Evaluation Note ? ?Tracey Griffin , a 26 y.o. female  was evaluated in triage.  Pt complains of chest pain.  Chest pain has been constant over the last week and a half.  Pain is located to mid sternum and radiates through to her back.  Pain described as "being punched in the chest."  No aggravating factors.  No associated nausea, vomiting, diaphoresis, shortness of breath, palpitations, lightheadedness or syncope. ? ?Patient denies any fever, chills, cough, hemoptysis, leg swelling or tenderness, history of DVT or PE, surgery in the last 4 weeks. ? ?Patient is on low-dose oral birth control. ? ?Review of Systems  ?Positive: Chest pain ?Negative: See above ? ?Physical Exam  ?BP (!) 135/99   Pulse (!) 103   Temp 98.4 ?F (36.9 ?C)   Resp 14   Ht 5\' 4"  (1.626 m)   Wt 75.8 kg   LMP 06/12/2021 (Approximate)   SpO2 100%   BMI 28.67 kg/m?  ?Gen:   Awake, no distress   ?Resp:  Normal effort, clear to auscultation bilaterally ?MSK:   Moves extremities without difficulty; no swelling or tenderness to bilateral lower extremities ?Other:  +2 radial pulse bilaterally.  Abdomen soft, nondistended, nontender no masses or pulsatile masses. ? ?Medical Decision Making  ?Medically screening exam initiated at 1:23 PM.  Appropriate orders placed.  [name] was informed that the remainder of the evaluation will be completed by another provider, this initial triage assessment does not replace that evaluation, and the importance of remaining in the ED until their evaluation is complete. ? ? ?  ?Tracey Barrios, PA-C ?06/17/21 1324 ? ?

## 2021-06-17 NOTE — ED Provider Notes (Signed)
?[facility] EMERGENCY DEPARTMENT ?Provider Note ? ? ?CSN: [id number] ?Arrival date & time: 06/17/21  1252 ? ?  ?History ? ?Chief Complaint  ?Patient presents with  ? Chest Pain  ? ? ?Tracey Griffin is a 26 y.o. female hx of Mental delay for evaluation of chest pain.  Going on for a week and a half.  Pain located subxiphoid area.  No radiation.  Not worse with exertion.  Nonpleuritic.  Not worse with food intake. Has right diarrhea, unchanged.  No left arm, back pain.  No history of similar, family history of sudden cardiac death, HOCM.  Eating and drinking normally at home.  Has intermittent dysuria per patient. Non-tender flank. No fever, emesis, cough, back pain, hx of PE, dvt, pelvic pain, numbness, weakness. ? ?HPI ? ?  ? ?Home Medications ?Prior to Admission medications   ?Medication Sig Start Date End Date Taking? Authorizing Provider  ?cetirizine (ZYRTEC) 10 MG chewable tablet Chew 10 mg by mouth daily.    [provider]  ?DEXILANT 60 MG capsule TAKE 1 CAPSULE(60 MG) BY MOUTH DAILY 11/13/18   Pyrtle, Carie Caddy, MD  ?dicyclomine (BENTYL) 20 MG tablet Take 1 tablet (20 mg total) by mouth 2 (two) times daily. Take as needed ?Patient not taking: Reported on 11/16/2017 10/06/17   Zehr, Princella Pellegrini, PA-C  ?metoCLOPramide (REGLAN) 10 MG tablet Take 1 tablet (10 mg total) by mouth every 6 (six) hours. 06/12/21   Gailen Shelter, PA  ?montelukast (SINGULAIR) 10 MG tablet Take 10 mg by mouth at bedtime.    [provider]  ?Darrol Angel [facility] FE) 0.4-35 MG-MCG tablet Chew 1 tablet by mouth at bedtime as needed.    [provider]  ?ranitidine (ZANTAC) 15 MG/ML syrup Take 2 mg/kg/day by mouth as needed for heartburn.    [provider]  ?   ? ?Allergies    ?Patient has no known allergies.   ? ?Review of Systems   ?Review of Systems  ?Constitutional: Negative.   ?HENT: Negative.  Voice change: chronic.   ?Respiratory: Negative.    ?Cardiovascular:  Positive for chest  pain. Negative for palpitations and leg swelling.  ?Gastrointestinal:  Positive for abdominal distention and diarrhea. Negative for abdominal pain, anal bleeding, blood in stool, constipation, nausea and vomiting.  ?Genitourinary: Negative.   ?Musculoskeletal: Negative.   ?Skin: Negative.   ?Neurological: Negative.   ?All other systems reviewed and are negative. ? ?Physical Exam ?Updated Vital Signs ?BP 127/86 (BP Location: Right Arm)   Pulse 88   Temp 98.4 ?F (36.9 ?C)   Resp 20   Ht 5\' 4"  (1.626 m)   Wt 75.8 kg   LMP 06/12/2021 (Approximate)   SpO2 100%   BMI 28.67 kg/m?  ?Physical Exam ?Vitals and nursing note reviewed.  ?Constitutional:   ?   General: She is not in acute distress. ?   Appearance: She is well-developed. She is not ill-appearing, toxic-appearing or diaphoretic.  ?HENT:  ?   Head: Normocephalic and atraumatic.  ?Eyes:  ?   Pupils: Pupils are equal, round, and reactive to light.  ?Cardiovascular:  ?   Rate and Rhythm: Normal rate.  ?   Pulses: Normal pulses.     ?     Radial pulses are 2+ on the right side and 2+ on the left side.  ?   Heart sounds: Normal heart sounds.  ?   Comments: Mild tenderness anterior chest wall/ subxiphoid area ?Pulmonary:  ?  Effort: Pulmonary effort is normal. No respiratory distress.  ?   Breath sounds: Normal breath sounds and air entry.  ?   Comments: Clear bilaterally, speaks in full sentences without difficulty ?Abdominal:  ?   General: Bowel sounds are normal. There is no distension.  ?   Palpations: Abdomen is soft.  ?   Tenderness: There is abdominal tenderness in the epigastric area. There is no right CVA tenderness, left CVA tenderness, guarding or rebound. Negative signs include Murphy's sign.  ?   Hernia: No hernia is present.  ? ? ?Musculoskeletal:     ?   General: Normal range of motion.  ?   Cervical back: Normal range of motion.  ?   Comments: No bony tenderness, compartments soft, Homan sign neg  ?Skin: ?   General: Skin is warm and dry.  ?    Capillary Refill: Capillary refill takes less than 2 seconds.  ?   Comments: No rash or lesions  ?Neurological:  ?   General: No focal deficit present.  ?   Mental Status: She is alert.  ?   Sensory: Sensation is intact.  ?   Motor: Motor function is intact.  ?   Gait: Gait is intact.  ?   Comments: CN 2-12 grossly intact ?Ambulatory ?Intact sensation  ?Psychiatric:     ?   Mood and Affect: Mood normal.  ? ? ?ED Results / Procedures / Treatments   ?Labs ?(all labs ordered are listed, but only abnormal results are displayed) ?Labs Reviewed  ?BASIC METABOLIC PANEL - Abnormal; Notable for the following components:  ?    Result Value  ? CO2 20 (*)   ? Glucose, Bld 163 (*)   ? All other components within normal limits  ?CBC WITH DIFFERENTIAL/PLATELET - Abnormal; Notable for the following components:  ? WBC 10.9 (*)   ? Platelets 422 (*)   ? All other components within normal limits  ?URINALYSIS, ROUTINE W REFLEX MICROSCOPIC - Abnormal; Notable for the following components:  ? APPearance HAZY (*)   ? All other components within normal limits  ?D-DIMER, QUANTITATIVE  ?LIPASE, BLOOD  ?HEPATIC FUNCTION PANEL  ?HEPATIC FUNCTION PANEL  ?I-STAT BETA HCG BLOOD, ED ([facility], [facility], [facility] ONLY)  ?TROPONIN I (HIGH SENSITIVITY)  ?TROPONIN I (HIGH SENSITIVITY)  ? ? ?EKG ?EKG Interpretation ? ?Date/Time:  Wednesday June 17 2021 16:31:05 EDT ?Ventricular Rate:  89 ?PR Interval:  232 ?QRS Duration: 92 ?QT Interval:  372 ?QTC Calculation: 452 ?R Axis:   66 ?Text Interpretation: Sinus rhythm with 1st degree A-V block T wave abnormality, consider anterior ischemia Abnormal ECG When compared with ECG of 17-Jun-2021 13:23, PREVIOUS ECG IS PRESENT similar to earlier today Confirmed by Ezequiel Essex [phone]) on 06/17/2021 4:48:58 PM ? ?Radiology ?DG Chest 2 View ? ?Result Date: 06/17/2021 ?CLINICAL DATA:  Chest pain EXAM: CHEST - 2 VIEW COMPARISON:  06/12/2021 FINDINGS: The heart size and mediastinal contours are within normal limits. Both lungs are  clear. The visualized skeletal structures are unremarkable. IMPRESSION: No active cardiopulmonary disease. Electronically Signed   By: Jerilynn Mages.  Shick M.D.   On: 06/17/2021 13:42   ? ?Procedures ?Procedures  ? ? ?Medications Ordered in ED ?Medications - No data to display ? ?ED Course/ Medical Decision Making/ A&P ?  ?26 year old history of developmental delay here for evaluation of chest pain over the last week and a half.  Located subxiphoid area.  No associate diaphoresis.  Nonexertional, nonpleuritic in nature.  Difficult historian due to  delay.  Mother helps provide history.  She is no clinical evidence of VTE on exam.  Does have some mild tenderness epigastric region.  Also does note some mild intermittent urinary frequency without dysuria. Not sexually active, no pelvic pain. ? ?Labs and imaging personally viewed and interpreted: ? ?CBC leukocytosis 10.9 ?UA negative for infection ?Metabolic panel glucose [id number] ?D-dimer WNL ?Trop 2  ?Preg neg ? ? ?Patient eloped from emergency department prior to completion of assessment.  Pending abdominal labs ? ? ?Lipase 35 ?Hep functional panel WNL ? ? ? ?                        ?Medical Decision Making ?Amount and/or Complexity of Data Reviewed ?Independent Historian: parent ?External Data Reviewed: labs, radiology, ECG and notes. ?Labs: ordered. Decision-making details documented in ED Course. ?Radiology: ordered and independent interpretation performed. Decision-making details documented in ED Course. ?ECG/medicine tests: ordered and independent interpretation performed. Decision-making details documented in ED Course. ? ?Risk ?OTC drugs. ?Prescription drug management. ? ? ? ? ? ? ? ? ?Final Clinical Impression(s) / ED Diagnoses ?Final diagnoses:  ?Atypical chest pain  ? ? ?Rx / DC Orders ?ED Discharge Orders   ? ? None  ? ?  ? ? ?  ?Kyann Heydt A, PA-C ?06/17/21 2017 ? ?  ?Ezequiel Essex, MD ?06/17/21 2230 ? ?

## 2021-06-17 NOTE — ED Notes (Signed)
Pt chose to leave AMA due to long wait times.  ?

## 2021-06-17 NOTE — ED Triage Notes (Signed)
Patient seen here last week for same, complaining of chest pain. Mother reports patients bp has been high.  Patient appears in no distress at this time  ?

## 2022-04-28 NOTE — Therapy (Signed)
OUTPATIENT PHYSICAL THERAPY LOWER EXTREMITY EVALUATION   Patient Name: Tracey Griffin MRN: [id number] DOB:Aug 08, 1995, 27 y.o., female Today's Date: 04/29/2022  END OF SESSION:  PT End of Session - 04/29/22 1304     Visit Number 1    Number of Visits 7    Date for PT Re-Evaluation 06/15/22    Authorization Type [employer]    Authorization Time Period 05/04/22 to 06/15/22    PT Start Time [id number]    PT Stop Time 1305    PT Time Calculation (min) 30 min    Activity Tolerance Patient tolerated treatment well    Behavior During Therapy [facility] for tasks assessed/performed             Past Medical History:  Diagnosis Date   Anxiety    Delayed developmental milestones    History reviewed. No pertinent surgical history. Patient Active Problem List   Diagnosis Date Noted   Gastroesophageal reflux disease 10/06/2017   Chronic cough 10/06/2017   Dysphagia 10/06/2017   Diarrhea 10/06/2017   Lower abdominal pain 10/06/2017    PCP: Juanda Chance   REFERRING PROVIDER: Mindi Curling, PA-C   REFERRING DIAG:  R26.89 (ICD-10-CM) - Other abnormalities of gait and mobility  Z91.89 (ICD-10-CM) - Other specified personal risk factors, not elsewhere classified    THERAPY DIAG:  Muscle weakness (generalized)  Upper back pain  Rationale for Evaluation and Treatment: Rehabilitation  ONSET DATE: lifelong  SUBJECTIVE:   SUBJECTIVE STATEMENT: Per her mother, she wants patient's ROM tested because she can't work with a trainer or the stretch zone. Reports upper back pain per mother.  Accompanied by mother:   PERTINENT HISTORY: Migraines, Anxiety, delayed developmental milestones PAIN:  Are you having pain?  Upper back but cannot verbalize pain level   PRECAUTIONS: None  WEIGHT BEARING RESTRICTIONS: No  FALLS:  Has patient fallen in last 6 months? No  LIVING ENVIRONMENT: Lives with: lives with their family Lives in: House/apartment Stairs: Yes: Internal: 13 steps;  on right going up Has following equipment at home: None  OCCUPATION: NA  PLOF: Independent  PATIENT GOALS: to assess patients ROM and strength and get her more active  NEXT MD VISIT: unsure  OBJECTIVE:   DIAGNOSTIC FINDINGS: NA  PATIENT SURVEYS:  Not appropriate  COGNITION: Overall cognitive status: History of cognitive impairments - at baseline and communicates some verbally, doesn't comprehend fully all direction     MUSCLE LENGTH: HS: marked bil Quads: WNL ITB: NT Piriformis: mod bil L >R Hip Flexors: WNL Heelcords: R tight Bil UT and scalene tightness   POSTURE: rounded shoulders and forward head  PALPATION: unremarkable  LOWER AND EXTREMITY ROM: WNL   LOWER EXTREMITY MMT:  MMT Right eval Left eval  Hip flexion 4 4  Hip extension 4+ 5  Hip abduction 4+ 5  Knee flexion 5 5  Knee extension 4 4  Ankle dorsiflexion 5 4+   (Blank rows = not tested)  UPPER EXTREMITY MMT:  MMT Right eval Left eval  Shoulder flexion 3+ 3+  Shoulder extension 4 4  Shoulder abduction 5 5  Elbow flexion 5 5  Elbow extension 4+ 4+   (Blank rows = not tested)   GAIT: Distance walked: 20 Assistive device utilized: None Level of assistance: Complete Independence Comments: no significant deviations   TODAY'S TREATMENT:  DATE:    04/29/22 EVAL ONLY  PATIENT EDUCATION:  Education details: PT eval findings and anticipated POC  Person educated: Patient and Parent Education method: Explanation Education comprehension: verbalized understanding  HOME EXERCISE PROGRAM: TBD  ASSESSMENT:  CLINICAL IMPRESSION: Tracey Griffin is a 27 y.o.female who was seen today for physical therapy evaluation and treatment to assess overall strength and ROM. Tracey Griffin is developmentally delayed and fairly sedentary per her mother who accompanied her. Her mother would like  her to become more active as she is complaining of upper back pain and also has headaches and migraines. Tracey Griffin has been turned away from the [facility] and some gyms because they want her to be physically assessed before allowing her to work out. She presents with a weak core and poor posture. She has a forward head, rounded shoulders and thoracic kyphosis in sitting. Lower extremity strength is 4 to 5/5 and UE 3+ to 5/5. She has significant flexibility deficits in her HS, hip rotators, upper traps, scalenes and suboccipitals. She will benefit from skilled PT to address these deficits.   OBJECTIVE IMPAIRMENTS: decreased cognition, decreased strength, impaired flexibility, postural dysfunction, and pain.   ACTIVITY LIMITATIONS:  N/A  PARTICIPATION LIMITATIONS:  N/A  PERSONAL FACTORS: Fitness and mental cognition  are also affecting patient's functional outcome.   REHAB POTENTIAL: Good  CLINICAL DECISION MAKING: Stable/uncomplicated  EVALUATION COMPLEXITY: Low   GOALS: Goals reviewed with patient? Yes  SHORT TERM GOALS: Target date: 05/18/2022 (Remove Blue Hyperlink)  Patient and caregiver will be independent with initial HEP. Baseline: no HEP Goal status: INITIAL   LONG TERM GOALS: Target date: 06/15/2022 (Remove Blue Hyperlink)  Patient and caregiver will be independent with advanced/ongoing HEP to improve outcomes and carryover.  Baseline: initial HEP Goal status: INITIAL  2.  Patient will demonstrate improved upper and lower extremity strength to 4+/5 or better to improve posture. Baseline: 3+ to 5/5 see objective chart Goal status: INITIAL    PLAN:  PT FREQUENCY: 1x/week  PT DURATION: 6 weeks  PLANNED INTERVENTIONS: Therapeutic exercises, Therapeutic activity, Neuromuscular re-education, Patient/Family education, Self Care, Joint mobilization, Spinal mobilization, Manual therapy, and Re-evaluation  PLAN FOR NEXT SESSION: Initiate HEP with fig 4 seated, HS stretch,  cerv/scap retraction, scalene/UT stretch, scapular and core strength, UE/LE strenth   Cornelius Marullo, PT 04/29/2022, 1:51 PM

## 2022-04-29 ENCOUNTER — Other Ambulatory Visit: Payer: Self-pay

## 2022-04-29 ENCOUNTER — Encounter: Payer: Self-pay | Admitting: Physical Therapy

## 2022-04-29 ENCOUNTER — Ambulatory Visit: Payer: Medicaid Other | Attending: Physician Assistant | Admitting: Physical Therapy

## 2022-04-29 DIAGNOSIS — R2689 Other abnormalities of gait and mobility: Secondary | ICD-10-CM | POA: Insufficient documentation

## 2022-04-29 DIAGNOSIS — M6281 Muscle weakness (generalized): Secondary | ICD-10-CM | POA: Diagnosis not present

## 2022-04-29 DIAGNOSIS — Z9189 Other specified personal risk factors, not elsewhere classified: Secondary | ICD-10-CM | POA: Diagnosis not present

## 2022-04-29 DIAGNOSIS — M549 Dorsalgia, unspecified: Secondary | ICD-10-CM | POA: Diagnosis not present

## 2022-05-13 ENCOUNTER — Ambulatory Visit: Payer: Medicaid Other

## 2022-05-13 DIAGNOSIS — R293 Abnormal posture: Secondary | ICD-10-CM

## 2022-05-13 DIAGNOSIS — M549 Dorsalgia, unspecified: Secondary | ICD-10-CM

## 2022-05-13 DIAGNOSIS — R252 Cramp and spasm: Secondary | ICD-10-CM

## 2022-05-13 DIAGNOSIS — R2689 Other abnormalities of gait and mobility: Secondary | ICD-10-CM | POA: Diagnosis not present

## 2022-05-13 DIAGNOSIS — R262 Difficulty in walking, not elsewhere classified: Secondary | ICD-10-CM

## 2022-05-13 DIAGNOSIS — M6281 Muscle weakness (generalized): Secondary | ICD-10-CM

## 2022-05-13 NOTE — Therapy (Signed)
OUTPATIENT PHYSICAL THERAPY LOWER EXTREMITY EVALUATION   Patient Name: Tracey Griffin MRN: [id number] DOB:February 15, 1996, 27 y.o., female Today's Date: 05/13/2022  END OF SESSION:  PT End of Session - 05/13/22 1100     Visit Number 2    Date for PT Re-Evaluation 06/15/22    Authorization Type [employer]    Authorization Time Period 05/04/22 to 06/15/22    PT Start Time 1015    PT Stop Time 1058    PT Time Calculation (min) 43 min    Activity Tolerance Patient tolerated treatment well    Behavior During Therapy [facility] for tasks assessed/performed              Past Medical History:  Diagnosis Date   Anxiety    Delayed developmental milestones    History reviewed. No pertinent surgical history. Patient Active Problem List   Diagnosis Date Noted   Gastroesophageal reflux disease 10/06/2017   Chronic cough 10/06/2017   Dysphagia 10/06/2017   Diarrhea 10/06/2017   Lower abdominal pain 10/06/2017    PCP: Juanda Chance   REFERRING PROVIDER: Mindi Curling, PA-C   REFERRING DIAG:  R26.89 (ICD-10-CM) - Other abnormalities of gait and mobility  Z91.89 (ICD-10-CM) - Other specified personal risk factors, not elsewhere classified    THERAPY DIAG:  Muscle weakness (generalized)  Upper back pain  Difficulty in walking, not elsewhere classified  Cramp and spasm  Abnormal posture  Rationale for Evaluation and Treatment: Rehabilitation  ONSET DATE: lifelong  SUBJECTIVE:   SUBJECTIVE STATEMENT: Accompanied by mother: no reports of any further issues.  Still wanting to work on coordination, strength and flexibility.     PERTINENT HISTORY: Migraines, Anxiety, delayed developmental milestones PAIN:  Are you having pain?  Upper back but cannot verbalize pain level   PRECAUTIONS: None  WEIGHT BEARING RESTRICTIONS: No  FALLS:  Has patient fallen in last 6 months? No  LIVING ENVIRONMENT: Lives with: lives with their family Lives in:  House/apartment Stairs: Yes: Internal: 13 steps; on right going up Has following equipment at home: None  OCCUPATION: NA  PLOF: Independent  PATIENT GOALS: to assess patients ROM and strength and get her more active  NEXT MD VISIT: unsure  OBJECTIVE:   DIAGNOSTIC FINDINGS: NA  PATIENT SURVEYS:  Not appropriate  COGNITION: Overall cognitive status: History of cognitive impairments - at baseline and communicates some verbally, doesn't comprehend fully all direction     MUSCLE LENGTH: HS: marked bil Quads: WNL ITB: NT Piriformis: mod bil L >R Hip Flexors: WNL Heelcords: R tight Bil UT and scalene tightness   POSTURE: rounded shoulders and forward head  PALPATION: unremarkable  LOWER AND EXTREMITY ROM: WNL   LOWER EXTREMITY MMT:  MMT Right eval Left eval  Hip flexion 4 4  Hip extension 4+ 5  Hip abduction 4+ 5  Knee flexion 5 5  Knee extension 4 4  Ankle dorsiflexion 5 4+   (Blank rows = not tested)  UPPER EXTREMITY MMT:  MMT Right eval Left eval  Shoulder flexion 3+ 3+  Shoulder extension 4 4  Shoulder abduction 5 5  Elbow flexion 5 5  Elbow extension 4+ 4+   (Blank rows = not tested)   GAIT: Distance walked: 20 Assistive device utilized: None Level of assistance: Complete Independence Comments: no significant deviations   TODAY'S TREATMENT:  DATE: 05/13/22 Nustep  x 5 min level 4 Supine sktc x 2 holding 10 sec each Supine hamstring stretch x 2 hold 10 sec each Open book stretch x 2 each side holding 10 sec each Seated piriformis stretch x 2 each side holding 10 sec each Seated alternating arm and leg x 20 (significant difficulty with coordination) Attempted supine alternating arm and leg (continued signicant difficulty with coordination) Seated march x 20 Seated alternting arms x 20 Attempted side lunges (patient  needed heavy vc's for correct technique due cognitive disabilities) x 10 Lunges to BOSU fwd and lateral x 10 each (heavy vc's for technique) Alternating tap ups on BOSU x 20 Rocker board x 2 min   04/29/22 EVAL ONLY  PATIENT EDUCATION:  Education details: PT eval findings and anticipated POC  Person educated: Patient and Parent Education method: Explanation Education comprehension: verbalized understanding  HOME EXERCISE PROGRAM: TBD  ASSESSMENT:  CLINICAL IMPRESSION: [name] does fairly well following directions but struggle significantly with coordination.  She is very cooperative and seems motivated.   She will benefit from continued skilled PT to address these deficits.   OBJECTIVE IMPAIRMENTS: decreased cognition, decreased strength, impaired flexibility, postural dysfunction, and pain.   ACTIVITY LIMITATIONS:  N/A  PARTICIPATION LIMITATIONS:  N/A  PERSONAL FACTORS: Fitness and mental cognition  are also affecting patient's functional outcome.   REHAB POTENTIAL: Good  CLINICAL DECISION MAKING: Stable/uncomplicated  EVALUATION COMPLEXITY: Low   GOALS: Goals reviewed with patient? Yes  SHORT TERM GOALS: Target date: 05/18/2022 (Remove Blue Hyperlink)  Patient and caregiver will be independent with initial HEP. Baseline: no HEP Goal status: INITIAL   LONG TERM GOALS: Target date: 06/15/2022 (Remove Blue Hyperlink)  Patient and caregiver will be independent with advanced/ongoing HEP to improve outcomes and carryover.  Baseline: initial HEP Goal status: INITIAL  2.  Patient will demonstrate improved upper and lower extremity strength to 4+/5 or better to improve posture. Baseline: 3+ to 5/5 see objective chart Goal status: INITIAL    PLAN:  PT FREQUENCY: 1x/week  PT DURATION: 6 weeks  PLANNED INTERVENTIONS: Therapeutic exercises, Therapeutic activity, Neuromuscular re-education, Patient/Family education, Self Care, Joint mobilization, Spinal mobilization,  Manual therapy, and Re-evaluation  PLAN FOR NEXT SESSION: Initiate HEP with fig 4 seated, HS stretch, cerv/scap retraction, scalene/UT stretch, scapular and core strength, UE/LE strenth   Aydan Phoenix B. Shandrell Boda, PT 05/13/22 11:02 AM [facility] [address], [place],  [address] Phone # [phone] Fax [phone]

## 2022-05-18 NOTE — Therapy (Signed)
OUTPATIENT PHYSICAL THERAPY LOWER EXTREMITY EVALUATION   Patient Name: Tracey Griffin MRN: [id number] DOB:Jul 17, 1995, 27 y.o., female Today's Date: 05/20/2022  END OF SESSION:  PT End of Session - 05/20/22 1014     Visit Number 3    Number of Visits 7    Date for PT Re-Evaluation 06/15/22    Authorization Type [employer]    Authorization Time Period 05/04/22 to 06/15/22    PT Start Time 1015    PT Stop Time 1058    PT Time Calculation (min) 43 min    Activity Tolerance Patient tolerated treatment well    Behavior During Therapy [facility] for tasks assessed/performed              Past Medical History:  Diagnosis Date   Anxiety    Delayed developmental milestones    History reviewed. No pertinent surgical history. Patient Active Problem List   Diagnosis Date Noted   Gastroesophageal reflux disease 10/06/2017   Chronic cough 10/06/2017   Dysphagia 10/06/2017   Diarrhea 10/06/2017   Lower abdominal pain 10/06/2017    PCP: Barbarann Ehlers   REFERRING PROVIDER: Jordan Hawks, PA-C   REFERRING DIAG:  R26.89 (ICD-10-CM) - Other abnormalities of gait and mobility  Z91.89 (ICD-10-CM) - Other specified personal risk factors, not elsewhere classified    THERAPY DIAG:  Muscle weakness (generalized)  Upper back pain  Difficulty in walking, not elsewhere classified  Cramp and spasm  Abnormal posture  Rationale for Evaluation and Treatment: Rehabilitation  ONSET DATE: lifelong  SUBJECTIVE:   SUBJECTIVE STATEMENT: Patient reports she has not pain. She is doing her exercises at home and they are going fine. Accompanied by mother:     PERTINENT HISTORY: Migraines, Anxiety, delayed developmental milestones PAIN:  Are you having pain?  Upper back but cannot verbalize pain level   PRECAUTIONS: None  WEIGHT BEARING RESTRICTIONS: No  FALLS:  Has patient fallen in last 6 months? No  LIVING ENVIRONMENT: Lives with: lives with their family Lives in:  House/apartment Stairs: Yes: Internal: 13 steps; on right going up Has following equipment at home: None  OCCUPATION: NA  PLOF: Independent  PATIENT GOALS: to assess patients ROM and strength and get her more active  NEXT MD VISIT: unsure  OBJECTIVE:   DIAGNOSTIC FINDINGS: NA  PATIENT SURVEYS:  Not appropriate  COGNITION: Overall cognitive status: History of cognitive impairments - at baseline and communicates some verbally, doesn't comprehend fully all direction     MUSCLE LENGTH: HS: marked bil Quads: WNL ITB: NT Piriformis: mod bil L >R Hip Flexors: WNL Heelcords: R tight Bil UT and scalene tightness   POSTURE: rounded shoulders and forward head  PALPATION: unremarkable  LOWER AND EXTREMITY ROM: WNL   LOWER EXTREMITY MMT:  MMT Right eval Left eval  Hip flexion 4 4  Hip extension 4+ 5  Hip abduction 4+ 5  Knee flexion 5 5  Knee extension 4 4  Ankle dorsiflexion 5 4+   (Blank rows = not tested)  UPPER EXTREMITY MMT:  MMT Right eval Left eval  Shoulder flexion 3+ 3+  Shoulder extension 4 4  Shoulder abduction 5 5  Elbow flexion 5 5  Elbow extension 4+ 4+   (Blank rows = not tested)   GAIT: Distance walked: 20 Assistive device utilized: None Level of assistance: Complete Independence Comments: no significant deviations   TODAY'S TREATMENT:  DATE: 05/20/22 Nustep  x 5 min level 4 Supine sktc x 2 holding 20 sec each Supine hamstring stretch x 2 hold 20 sec each Open book stretch x 2 each side holding 10 sec each Seated piriformis stretch x 2 each side holding 10 sec each Seated knee ext RTB 2x10 Standing shoulder flex and ABD 3# wt to 90 deg x 10 ea Seated rows RTB x 20 Standing shoulder ext YTB x 10 (difficult to coordinate as was horizontal ABD) Plank 5 x 5 sec Prone hip ext x 5 ea Setated UT stretch R side only  x 30 sec  DATE: 05/13/22 Nustep  x 5 min level 4 Supine sktc x 2 holding 10 sec each Supine hamstring stretch x 2 hold 10 sec each Open book stretch x 2 each side holding 10 sec each Seated piriformis stretch x 2 each side holding 10 sec each Seated alternating arm and leg x 20 (significant difficulty with coordination) Attempted supine alternating arm and leg (continued signicant difficulty with coordination) Seated march x 20 Seated alternting arms x 20 Attempted side lunges (patient needed heavy vc's for correct technique due cognitive disabilities) x 10 Lunges to BOSU fwd and lateral x 10 each (heavy vc's for technique) Alternating tap ups on BOSU x 20 Rocker board x 2 min   04/29/22 EVAL ONLY  PATIENT EDUCATION:  Education details: PT eval findings and anticipated POC  Person educated: Patient and Parent Education method: Explanation Education comprehension: verbalized understanding  HOME EXERCISE PROGRAM: Access Code: [email or web address] URL: [email or web address] Date: 05/20/2022 Prepared by: Raynelle Fanning  Exercises - Supine Single Knee to Chest Stretch  - 2 x daily - 7 x weekly - 1 sets - 3 reps - 20-30 sec hold - Seated Piriformis Stretch with Trunk Bend  - 2 x daily - 7 x weekly - 1 sets - 3 reps - 30 sec  hold - Sitting Knee Extension with Resistance  - 1 x daily - 3 x weekly - 3 sets - 10 reps - Standard Plank  - 2 x daily - 7 x weekly - 1 sets - 5 reps - 5 -10 sec hold - Standing Bilateral Low Shoulder Row with Anchored Resistance  - 1 x daily - 3 x weekly - 2-3 sets - 10 reps ASSESSMENT:  CLINICAL IMPRESSION: Deshawnda did well with most exercises today. She had difficulty with shoulder extensions, horizontal ABD and chin tucks. She did well with planks with cueing and would benefit from more of these. HEP was inititiated. Caretta continues to demonstrate potential for improvement and would benefit from continued skilled therapy to address impairments.    OBJECTIVE  IMPAIRMENTS: decreased cognition, decreased strength, impaired flexibility, postural dysfunction, and pain.   ACTIVITY LIMITATIONS:  N/A  PARTICIPATION LIMITATIONS:  N/A  PERSONAL FACTORS: Fitness and mental cognition  are also affecting patient's functional outcome.   REHAB POTENTIAL: Good  CLINICAL DECISION MAKING: Stable/uncomplicated  EVALUATION COMPLEXITY: Low   GOALS: Goals reviewed with patient? Yes  SHORT TERM GOALS: Target date: 05/18/2022 (Remove Blue Hyperlink)  Patient and caregiver will be independent with initial HEP. Baseline: no HEP Goal status: ongoing   LONG TERM GOALS: Target date: 06/15/2022 (Remove Blue Hyperlink)  Patient and caregiver will be independent with advanced/ongoing HEP to improve outcomes and carryover.  Baseline: initial HEP Goal status: INITIAL  2.  Patient will demonstrate improved upper and lower extremity strength to 4+/5 or better to improve posture. Baseline: 3+ to 5/5 see objective chart Goal status: INITIAL  PLAN:  PT FREQUENCY: 1x/week  PT DURATION: 6 weeks  PLANNED INTERVENTIONS: Therapeutic exercises, Therapeutic activity, Neuromuscular re-education, Patient/Family education, Self Care, Joint mobilization, Spinal mobilization, Manual therapy, and Re-evaluation  PLAN FOR NEXT SESSION: Initiate HEP with fig 4 seated, HS stretch, cerv/scap retraction, scalene/UT stretch, scapular and core strength, UE/LE strenth   Merrilyn Legler, PT  05/20/22 11:03 AM [facility] [address], [address] [place], [place] [address] Phone # [phone] Fax [phone]

## 2022-05-20 ENCOUNTER — Encounter: Payer: Self-pay | Admitting: Physical Therapy

## 2022-05-20 ENCOUNTER — Ambulatory Visit: Payer: Medicaid Other | Admitting: Physical Therapy

## 2022-05-20 DIAGNOSIS — R293 Abnormal posture: Secondary | ICD-10-CM

## 2022-05-20 DIAGNOSIS — R252 Cramp and spasm: Secondary | ICD-10-CM

## 2022-05-20 DIAGNOSIS — R262 Difficulty in walking, not elsewhere classified: Secondary | ICD-10-CM

## 2022-05-20 DIAGNOSIS — M549 Dorsalgia, unspecified: Secondary | ICD-10-CM

## 2022-05-20 DIAGNOSIS — R2689 Other abnormalities of gait and mobility: Secondary | ICD-10-CM | POA: Diagnosis not present

## 2022-05-20 DIAGNOSIS — M6281 Muscle weakness (generalized): Secondary | ICD-10-CM

## 2022-05-26 NOTE — Therapy (Signed)
OUTPATIENT PHYSICAL THERAPY LOWER EXTREMITY EVALUATION   Patient Name: Tracey Griffin MRN: [id number] DOB:[birth year], 27 y.o., female Today's Date: 05/27/2022  END OF SESSION:  PT End of Session - 05/27/22 0845     Visit Number 4    Number of Visits 6    Date for PT Re-Evaluation 06/15/22    Authorization Type [employer]    Authorization Time Period 05/04/22 to 06/15/22    Authorization - Visit Number 3    Authorization - Number of Visits 5    PT Start Time 0846    PT Stop Time 0932    PT Time Calculation (min) 46 min    Activity Tolerance Patient tolerated treatment well    Behavior During Therapy [facility] for tasks assessed/performed               Past Medical History:  Diagnosis Date   Anxiety    Delayed developmental milestones    History reviewed. No pertinent surgical history. Patient Active Problem List   Diagnosis Date Noted   Gastroesophageal reflux disease 10/06/2017   Chronic cough 10/06/2017   Dysphagia 10/06/2017   Diarrhea 10/06/2017   Lower abdominal pain 10/06/2017    PCP: Juanda Chance   REFERRING PROVIDER: Mindi Curling, PA-C   REFERRING DIAG:  R26.89 (ICD-10-CM) - Other abnormalities of gait and mobility  Z91.89 (ICD-10-CM) - Other specified personal risk factors, not elsewhere classified    THERAPY DIAG:  Muscle weakness (generalized)  Upper back pain  Difficulty in walking, not elsewhere classified  Cramp and spasm  Abnormal posture  Rationale for Evaluation and Treatment: Rehabilitation  ONSET DATE: lifelong  SUBJECTIVE:   SUBJECTIVE STATEMENT: Patient reports that she does her exercises every other day. Accompanied by mother:     PERTINENT HISTORY: Migraines, Anxiety, delayed developmental milestones PAIN:  Are you having pain?  Upper back but cannot verbalize pain level   PRECAUTIONS: None  WEIGHT BEARING RESTRICTIONS: No  FALLS:  Has patient fallen in last 6 months? No  LIVING ENVIRONMENT: Lives  with: lives with their family Lives in: House/apartment Stairs: Yes: Internal: 13 steps; on right going up Has following equipment at home: None  OCCUPATION: NA  PLOF: Independent  PATIENT GOALS: to assess patients ROM and strength and get her more active  NEXT MD VISIT: unsure  OBJECTIVE:   DIAGNOSTIC FINDINGS: NA  PATIENT SURVEYS:  Not appropriate  COGNITION: Overall cognitive status: History of cognitive impairments - at baseline and communicates some verbally, doesn't comprehend fully all direction     MUSCLE LENGTH: HS: marked bil Quads: WNL ITB: NT Piriformis: mod bil L >R Hip Flexors: WNL Heelcords: R tight Bil UT and scalene tightness   POSTURE: rounded shoulders and forward head  PALPATION: unremarkable  LOWER AND EXTREMITY ROM: WNL   LOWER EXTREMITY MMT:  MMT Right eval Left eval  Hip flexion 4 4  Hip extension 4+ 5  Hip abduction 4+ 5  Knee flexion 5 5  Knee extension 4 4  Ankle dorsiflexion 5 4+   (Blank rows = not tested)  UPPER EXTREMITY MMT:  MMT Right eval Left eval  Shoulder flexion 3+ 3+  Shoulder extension 4 4  Shoulder abduction 5 5  Elbow flexion 5 5  Elbow extension 4+ 4+   (Blank rows = not tested)   GAIT: Distance walked: 20 Assistive device utilized: None Level of assistance: Complete Independence Comments: no significant deviations   TODAY'S TREATMENT:  DATE: 05/25/22 Bike L3  x 5 min Seated knee ext blue loop 2x10 Standing shoulder flex and ABD 3# wt to 90 deg x 10 ea Standing rows RTB x 20 Standing Yellow loop - hip ABD x 20 bil, sidestepping 4 x 6 steps, monster walk x 10 feet x 4 Plank 5 x 5 sec Attempted side plank too difficult Prone T and Y 2x10 ea Prone hip ext x 5 ea Seated piriformis stretch x 2 each side holding 10 sec each Setated UT and LS stretch 2 x 30 sec   DATE:  05/20/22 Nustep  x 5 min level 4 Supine sktc x 2 holding 20 sec each Supine hamstring stretch x 2 hold 20 sec each Open book stretch x 2 each side holding 10 sec each Seated piriformis stretch x 2 each side holding 10 sec each Seated knee ext RTB 2x10 Standing shoulder flex and ABD 3# wt to 90 deg x 10 ea Seated rows RTB x 20 Standing shoulder ext YTB x 10 (difficult to coordinate as was horizontal ABD) Plank 5 x 5 sec Prone hip ext x 5 ea Setated UT stretch R side only x 30 sec  DATE: 05/13/22 Nustep  x 5 min level 4 Supine sktc x 2 holding 10 sec each Supine hamstring stretch x 2 hold 10 sec each Open book stretch x 2 each side holding 10 sec each Seated piriformis stretch x 2 each side holding 10 sec each Seated alternating arm and leg x 20 (significant difficulty with coordination) Attempted supine alternating arm and leg (continued signicant difficulty with coordination) Seated march x 20 Seated alternting arms x 20 Attempted side lunges (patient needed heavy vc's for correct technique due cognitive disabilities) x 10 Lunges to BOSU fwd and lateral x 10 each (heavy vc's for technique) Alternating tap ups on BOSU x 20 Rocker board x 2 min   04/29/22 EVAL ONLY  PATIENT EDUCATION:  Education details: PT eval findings and anticipated POC  Person educated: Patient and Parent Education method: Explanation Education comprehension: verbalized understanding  HOME EXERCISE PROGRAM: Access Code: [email or web address] URL: [email or web address] Date: 05/27/2022 Prepared by: Almyra Free  Exercises - Supine Single Knee to Chest Stretch  - 2 x daily - 7 x weekly - 1 sets - 3 reps - 20-30 sec hold - Seated Piriformis Stretch with Trunk Bend  - 2 x daily - 7 x weekly - 1 sets - 3 reps - 30 sec  hold - Sitting Knee Extension with Resistance  - 1 x daily - 3 x weekly - 3 sets - 10 reps - Standard Plank  - 2 x daily - 7 x weekly - 1 sets - 5 reps - 5 -10 sec hold - Standing Bilateral Low  Shoulder Row with Anchored Resistance  - 1 x daily - 3 x weekly - 2-3 sets - 10 reps - Standing Hip Abduction with Resistance at Thighs  - 1 x daily - 3 x weekly - 3 sets - 10 reps - Prone Shoulder Horizontal Abduction with Thumbs Up  - 1 x daily - 3 x weekly - 3 sets - 10 reps - Prone Scapular Retraction Y  - 1 x daily - 3 x weekly - 3 sets - 10 reps - Standing Shoulder Horizontal Abduction with Dumbbells - Thumbs Up  - 1 x daily - 7 x weekly - 3 sets - 10 reps - Standing Shoulder Flexion to 90 Degrees with Dumbbells  - 1 x daily - 7 x weekly -  3 sets - 10 reps  ASSESSMENT:  CLINICAL IMPRESSION: Hermie continues to do well with therex with cueing. She found prone T and Y exercises challenging and she did say she felt them in her neck somewhat. PT had pt do a few reps with her forehead on a rolled towel which helped with neck strain. HEP was progressed with most challenging exercises. Wallace continues to demonstrate potential for improvement and would benefit from continued skilled therapy to address impairments.     OBJECTIVE IMPAIRMENTS: decreased cognition, decreased strength, impaired flexibility, postural dysfunction, and pain.   ACTIVITY LIMITATIONS:  N/A  PARTICIPATION LIMITATIONS:  N/A  PERSONAL FACTORS: Fitness and mental cognition  are also affecting patient's functional outcome.   REHAB POTENTIAL: Good  CLINICAL DECISION MAKING: Stable/uncomplicated  EVALUATION COMPLEXITY: Low   GOALS: Goals reviewed with patient? Yes  SHORT TERM GOALS: Target date: 05/18/2022 (Remove Blue Hyperlink)  Patient and caregiver will be independent with initial HEP. Baseline: no HEP Goal status: MET   LONG TERM GOALS: Target date: 06/15/2022 (Remove Blue Hyperlink)  Patient and caregiver will be independent with advanced/ongoing HEP to improve outcomes and carryover.  Baseline: initial HEP Goal status: IN PROGRESS  2.  Patient will demonstrate improved upper and lower extremity strength to  4+/5 or better to improve posture. Baseline: 3+ to 5/5 see objective chart Goal status: IN PROGRESS    PLAN:  PT FREQUENCY: 1x/week  PT DURATION: 6 weeks  PLANNED INTERVENTIONS: Therapeutic exercises, Therapeutic activity, Neuromuscular re-education, Patient/Family education, Self Care, Joint mobilization, Spinal mobilization, Manual therapy, and Re-evaluation  PLAN FOR NEXT SESSION: Initiate HEP with fig 4 seated, HS stretch, cerv/scap retraction, scalene/UT stretch, scapular and core strength, UE/LE strenth   Magdalene Tardiff, PT  05/27/22 9:41 AM [facility] [address] [place], [place] [address] Phone # [phone] Fax [phone]

## 2022-05-27 ENCOUNTER — Encounter: Payer: Self-pay | Admitting: Physical Therapy

## 2022-05-27 ENCOUNTER — Ambulatory Visit: Payer: Medicaid Other | Attending: Physician Assistant | Admitting: Physical Therapy

## 2022-05-27 DIAGNOSIS — R252 Cramp and spasm: Secondary | ICD-10-CM | POA: Diagnosis present

## 2022-05-27 DIAGNOSIS — R293 Abnormal posture: Secondary | ICD-10-CM | POA: Diagnosis present

## 2022-05-27 DIAGNOSIS — R262 Difficulty in walking, not elsewhere classified: Secondary | ICD-10-CM | POA: Diagnosis present

## 2022-05-27 DIAGNOSIS — M6281 Muscle weakness (generalized): Secondary | ICD-10-CM | POA: Diagnosis present

## 2022-05-27 DIAGNOSIS — M549 Dorsalgia, unspecified: Secondary | ICD-10-CM | POA: Insufficient documentation

## 2022-06-15 ENCOUNTER — Ambulatory Visit: Payer: Medicaid Other

## 2022-06-22 ENCOUNTER — Ambulatory Visit: Payer: Medicaid Other

## 2022-06-22 DIAGNOSIS — M6281 Muscle weakness (generalized): Secondary | ICD-10-CM

## 2022-06-22 DIAGNOSIS — R252 Cramp and spasm: Secondary | ICD-10-CM

## 2022-06-22 DIAGNOSIS — R293 Abnormal posture: Secondary | ICD-10-CM

## 2022-06-22 DIAGNOSIS — M549 Dorsalgia, unspecified: Secondary | ICD-10-CM

## 2022-06-22 DIAGNOSIS — R262 Difficulty in walking, not elsewhere classified: Secondary | ICD-10-CM

## 2022-06-22 NOTE — Therapy (Signed)
OUTPATIENT PHYSICAL THERAPY LOWER EXTREMITY EVALUATION PHYSICAL THERAPY DISCHARGE SUMMARY  Visits from Start of Care: 5  Current functional level related to goals / functional outcomes: See below   Remaining deficits: See below   Education / Equipment: See below   Patient agrees to discharge. Patient goals were met. Patient is being discharged due to meeting the stated rehab goals.    Patient Name: Tracey Griffin MRN: [id number] DOB:01-11-1996, 27 y.o., female Today's Date: 06/22/2022  END OF SESSION:  PT End of Session - 06/22/22 1027     Visit Number 5    Number of Visits --    Date for PT Re-Evaluation 06/15/22    Authorization Type [employer]    Authorization Time Period [employer] approved 5 visits 05/04/2022-07/02/2022 auth#[id number]  [employer]  Author req  27 vl    Authorization - Visit Number 5    Authorization - Number of Visits 5    Progress Note Due on Visit 5    PT Start Time 1023    PT Stop Time 1058    PT Time Calculation (min) 35 min    Activity Tolerance Patient tolerated treatment well    Behavior During Therapy WFL for tasks assessed/performed               Past Medical History:  Diagnosis Date   Anxiety    Delayed developmental milestones    History reviewed. No pertinent surgical history. Patient Active Problem List   Diagnosis Date Noted   Gastroesophageal reflux disease 10/06/2017   Chronic cough 10/06/2017   Dysphagia 10/06/2017   Diarrhea 10/06/2017   Lower abdominal pain 10/06/2017    PCP: Barbarann Ehlers   REFERRING PROVIDER: Jordan Hawks, PA-C   REFERRING DIAG:  R26.89 (ICD-10-CM) - Other abnormalities of gait and mobility  Z91.89 (ICD-10-CM) - Other specified personal risk factors, not elsewhere classified    THERAPY DIAG:  Muscle weakness (generalized)  Upper back pain  Difficulty in walking, not elsewhere classified  Cramp and spasm  Abnormal posture  Rationale for Evaluation and  Treatment: Rehabilitation  ONSET DATE: lifelong  SUBJECTIVE:   SUBJECTIVE STATEMENT: Patient arrives without mother.  States she is doing good and mother would like for this to be her last day.      PERTINENT HISTORY: Migraines, Anxiety, delayed developmental milestones PAIN:  Are you having pain? No  PRECAUTIONS: None  WEIGHT BEARING RESTRICTIONS: No  FALLS:  Has patient fallen in last 6 months? No  LIVING ENVIRONMENT: Lives with: lives with their family Lives in: House/apartment Stairs: Yes: Internal: 13 steps; on right going up Has following equipment at home: None  OCCUPATION: NA  PLOF: Independent  PATIENT GOALS: to assess patients ROM and strength and get her more active  NEXT MD VISIT: unsure  OBJECTIVE:   DIAGNOSTIC FINDINGS: NA  PATIENT SURVEYS:  Not appropriate  COGNITION: Overall cognitive status: History of cognitive impairments - at baseline and communicates some verbally, doesn't comprehend fully all direction     MUSCLE LENGTH: HS: marked bil Quads: WNL ITB: NT Piriformis: mod bil L >R Hip Flexors: WNL Heelcords: R tight Bil UT and scalene tightness   POSTURE: rounded shoulders and forward head  PALPATION: unremarkable  LOWER AND EXTREMITY ROM: WNL   LOWER EXTREMITY MMT:  MMT Right eval Right 06/22/22 Left eval Left 06/22/22  Hip flexion 4 4+ 4 4+  Hip extension 4+ 4+ 5 5  Hip abduction 4+ 4+ 5 5  Knee flexion 5  5 5 5   Knee extension 4 4+ 4 4+  Ankle dorsiflexion 5 5 4+ 4+   (Blank rows = not tested)  UPPER EXTREMITY MMT:  MMT Right eval Right 06/22/22 Left eval Left 06/22/22  Shoulder flexion 3+ 4 3+ 4  Shoulder extension 4 4 4 4   Shoulder abduction 5 5 5 5   Elbow flexion 5 5 5 5   Elbow extension 4+ 5 4+ 5   (Blank rows = not tested)   GAIT: Distance walked: 20 Assistive device utilized: None Level of assistance: Complete Independence Comments: no significant deviations   TODAY'S TREATMENT:                                                                                                                               DATE: 05/25/22 Nustep x 5 min level 5 Standing theraband postural exercises Reviewed all of HEP DC assessment completed Heavy emphasis and instruction on posture x 8 min  DATE: 05/25/22 Bike L3  x 5 min Seated knee ext blue loop 2x10 Standing shoulder flex and ABD 3# wt to 90 deg x 10 ea Standing rows RTB x 20 Standing Yellow loop - hip ABD x 20 bil, sidestepping 4 x 6 steps, monster walk x 10 feet x 4 Plank 5 x 5 sec Attempted side plank too difficult Prone T and Y 2x10 ea Prone hip ext x 5 ea Seated piriformis stretch x 2 each side holding 10 sec each Setated UT and LS stretch 2 x 30 sec   DATE: 05/20/22 Nustep  x 5 min level 4 Supine sktc x 2 holding 20 sec each Supine hamstring stretch x 2 hold 20 sec each Open book stretch x 2 each side holding 10 sec each Seated piriformis stretch x 2 each side holding 10 sec each Seated knee ext RTB 2x10 Standing shoulder flex and ABD 3# wt to 90 deg x 10 ea Seated rows RTB x 20 Standing shoulder ext YTB x 10 (difficult to coordinate as was horizontal ABD) Plank 5 x 5 sec Prone hip ext x 5 ea Setated UT stretch R side only x 30 sec  DATE: 05/13/22 Nustep  x 5 min level 4 Supine sktc x 2 holding 10 sec each Supine hamstring stretch x 2 hold 10 sec each Open book stretch x 2 each side holding 10 sec each Seated piriformis stretch x 2 each side holding 10 sec each Seated alternating arm and leg x 20 (significant difficulty with coordination) Attempted supine alternating arm and leg (continued signicant difficulty with coordination) Seated march x 20 Seated alternting arms x 20 Attempted side lunges (patient needed heavy vc's for correct technique due cognitive disabilities) x 10 Lunges to BOSU fwd and lateral x 10 each (heavy vc's for technique) Alternating tap ups on BOSU x 20 Rocker board x 2 min   04/29/22 EVAL ONLY  PATIENT  EDUCATION:  Education details: PT eval findings and anticipated POC  Person educated:  Patient and Parent Education method: Explanation Education comprehension: verbalized understanding  HOME EXERCISE PROGRAM: Access Code: [email or web address] URL: [email or web address] Date: 05/27/2022 Prepared by: Raynelle Fanning  Exercises - Supine Single Knee to Chest Stretch  - 2 x daily - 7 x weekly - 1 sets - 3 reps - 20-30 sec hold - Seated Piriformis Stretch with Trunk Bend  - 2 x daily - 7 x weekly - 1 sets - 3 reps - 30 sec  hold - Sitting Knee Extension with Resistance  - 1 x daily - 3 x weekly - 3 sets - 10 reps - Standard Plank  - 2 x daily - 7 x weekly - 1 sets - 5 reps - 5 -10 sec hold - Standing Bilateral Low Shoulder Row with Anchored Resistance  - 1 x daily - 3 x weekly - 2-3 sets - 10 reps - Standing Hip Abduction with Resistance at Thighs  - 1 x daily - 3 x weekly - 3 sets - 10 reps - Prone Shoulder Horizontal Abduction with Thumbs Up  - 1 x daily - 3 x weekly - 3 sets - 10 reps - Prone Scapular Retraction Y  - 1 x daily - 3 x weekly - 3 sets - 10 reps - Standing Shoulder Horizontal Abduction with Dumbbells - Thumbs Up  - 1 x daily - 7 x weekly - 3 sets - 10 reps - Standing Shoulder Flexion to 90 Degrees with Dumbbells  - 1 x daily - 7 x weekly - 3 sets - 10 reps  ASSESSMENT:  CLINICAL IMPRESSION: Saavi has met all goals and is requesting DC.  She states she is no longer having pain and Mom would like for this to be her last day.  We spent a lot of time on posture today.  She was unable to keep upright posture without shoulder elevation.  When instructed to relax her shoulders away from her ears, she would go into a slumped position.  She was able to demonstrate proper posture but needed heavy verbal, visual and tactile cues.  She has difficulty following commands but mother will be helping her with her HEP.  She should continue to improve if she is able to stay compliant with her HEP.     OBJECTIVE IMPAIRMENTS: decreased cognition, decreased strength, impaired flexibility, postural dysfunction, and pain.   ACTIVITY LIMITATIONS:  N/A  PARTICIPATION LIMITATIONS:  N/A  PERSONAL FACTORS: Fitness and mental cognition  are also affecting patient's functional outcome.   REHAB POTENTIAL: Good  CLINICAL DECISION MAKING: Stable/uncomplicated  EVALUATION COMPLEXITY: Low   GOALS: Goals reviewed with patient? Yes  SHORT TERM GOALS: Target date: 05/18/2022 (Remove Blue Hyperlink)  Patient and caregiver will be independent with initial HEP. Baseline: no HEP Goal status: MET   LONG TERM GOALS: Target date: 06/15/2022 (Remove Blue Hyperlink)  Patient and caregiver will be independent with advanced/ongoing HEP to improve outcomes and carryover.  Baseline: initial HEP Goal status: MET  2.  Patient will demonstrate improved upper and lower extremity strength to 4+/5 or better to improve posture. Baseline: 3+ to 5/5 see objective chart Goal status: MET    PLAN:  PT FREQUENCY: 1x/week  PT DURATION: 6 weeks  PLANNED INTERVENTIONS: Therapeutic exercises, Therapeutic activity, Neuromuscular re-education, Patient/Family education, Self Care, Joint mobilization, Spinal mobilization, Manual therapy, and Re-evaluation  PLAN FOR NEXT SESSION: We will DC at this time with HEP.   Victorino Dike B. Morrigan Wickens, PT 06/22/22 11:06 AM  [facility] [address] [place],  [place] [address] Phone # [phone] Fax [phone]

## 2023-01-04 ENCOUNTER — Ambulatory Visit (HOSPITAL_COMMUNITY): Payer: Self-pay | Admitting: Student

## 2023-01-06 ENCOUNTER — Ambulatory Visit (INDEPENDENT_AMBULATORY_CARE_PROVIDER_SITE_OTHER): Payer: Medicaid Other | Admitting: Student

## 2023-01-06 DIAGNOSIS — F819 Developmental disorder of scholastic skills, unspecified: Secondary | ICD-10-CM

## 2023-01-06 DIAGNOSIS — F430 Acute stress reaction: Secondary | ICD-10-CM | POA: Diagnosis not present

## 2023-01-06 DIAGNOSIS — F411 Generalized anxiety disorder: Secondary | ICD-10-CM | POA: Diagnosis not present

## 2023-01-06 NOTE — Progress Notes (Signed)
Psychiatric Initial Adult Assessment  Patient Identification: Tracey Griffin MRN:  [id number] Date of Evaluation:  01/06/2023 Referral Source: PCP  Assessment:  Tracey Griffin is a 27 y.o. 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  Patient and mom report the program in which Tracey Griffin is seeking to attend requires documentation stating that [name] has confirmed diagnoses and is not a safety concern to herself nor others at this time.  Mom reports that patient has had an IEP all throughout schooling, completing high school at the age of 38.  Patient has never been with significantly disruptive behaviors, has never engaged in harmful behaviors toward herself nor toward others, and is looking forward to the programming offered within the day program.  Patient states that she is excited about learning to cook in the program.  She reports that she and mom live with her grandfather, and her grandfather becomes upset with her a lot.  Sometimes she does not understand why he is upset with her, and this instills some fear within her.    Mom reports that patient is generally anxious, particularly pertaining to loud noises and confrontation.  When these things occur, patient tends to run and hide looking to get away from them.  She was prescribed Zoloft by her PCP, which has provided significant benefit for her anxiety.  Patient is not a danger to herself nor to others and would be able to participate in programming appropriately.  No changes to her medications are warranted at this time, and patient is not in need of psychiatric follow-up.  PCP can continue to manage her Zoloft.  Mom and patient advised of return to clinic criteria as well as resources available in the case of psychiatric emergencies.  Mom and patient also advised  that I would be more than happy to fill out any necessary documentation for her program or write a letter of support.  If the medical record is needed, mom was advised to complete an ROI to gain access to assessment notes.  Risk Assessment: A suicide and violence risk assessment was performed as part of this evaluation. There patient is deemed to be at chronic elevated risk for self-harm/suicide given the following factors: N/A. These risk factors are mitigated by the following factors: lack of active SI/HI, no known access to weapons or firearms, no history of previous suicide attempts, no history of violence, motivation for treatment, utilization of positive coping skills, supportive family, sense of responsibility to family and social supports, presence of an available support system, current treatment compliance, safe housing, support system in agreement with treatment recommendations, and presence of a safety plan with follow-up care. The patient is deemed to be at chronic elevated risk for violence given the following factors: N/A. These risk factors are mitigated by the following factors: no known history of violence towards others, no known violence towards others in the last 6 months, no known history of threats of harm towards others, no known homicidal ideation in the last 6 months, no command hallucinations to harm others in the last 6 months, no active symptoms of psychosis, no active symptoms of mania, low impulsivity, intolerant attitude toward deviance, and connectedness to family. There is no acute risk for suicide or violence at this time. The patient was educated about relevant modifiable risk factors including following recommendations for treatment of psychiatric  illness and abstaining from substance abuse.  While future psychiatric events cannot be accurately predicted, the patient does not currently require  acute inpatient psychiatric care and does not currently meet [facility]  involuntary commitment criteria.    Plan:  # Intellectual delay #History of anxiety Past medication trials:  Status of problem: New to this provider; managed by PCP Interventions: -- Continue Zoloft 100 mg daily for anxiety, as prescribed by PCP   Patient was given contact information for behavioral health clinic and was instructed to call 911 for emergencies.    Patient and plan of care will be discussed with the Attending MD ,Dr. Josephina Shih, who agrees with the above statement and plan.   Subjective:  Chief Complaint: No chief complaint on file.   History of Present Illness:  Patient presents with mom, who reports that she is "mentally handicapped." They are working on getting her into an adult day program, but needs a psychiatric evaluation prior to acceptance. Program wants to know that she has control of her behaviors.   Mom reports that she has never had behavior issues out of patient, but she is more reserved in confrontational situations, whether involving her or not, and she tends to shy away from them. IQ, mom believes is 80. Completed 12th grade in 2017 at the age of 52.  Pregnancy: Mom had viral infections first 5 months of pregnancy. Term. Delivery: Prolonged labor, C-section. Jaundiced at birth. Developmental milestones: Delayed rolling over, sitting up, never crawled, but scooted on butt. Did not talk, but screamed for the first 4 years of her life, as she did not have vocabulary. Walking delayed; 1.5 years.  OT, Physical therapy, and Speech therapy.  -Did not know how to catch herself if she fell. Delay in involuntary responses.   IEP all throughout schooling.   Neurologist at 27 year old, told that she would not be able to walk. Geneticist- no genetic components identified.  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 None noted in high school.   She has been evaluated on EEG, and epileptic activity  noted.   Previously took Lexapro, made her more irritbale. Zoloft has helped with reactivity to traumatic events and sirens; her anxiety was significant.   Special Olympics; excelled in running and playing basketball. Has poor hand-eye-coordination.   Omariana is excited about the Day Program, cooking. She does get along well with her sister and niece. First time in a Day Program.  Kaisy is afraid of her grandfather because he is mean to to her. She is also afraid of people acting irrationally.   She describes her anxiety as feeling scared.   Denies SI, HI, AVH.   Past Psychiatric History:  Diagnoses: Developmentally delayed; delayed expressive and receptive language.  no ASD diagnosis given. Unable to read, spell, difficulty carrying conversations.  Medication trials: Lexapro, Zoloft: RX  by PCP Lowella Petties at [facility] Previous psychiatrist/therapist: Once, remotely; unable to engage.  Hospitalizations: Denies Suicide attempts: Denies SIB: Denies Hx of violence towards others: Denies Current access to guns: Denies Hx of trauma/abuse: Denies  Substance Abuse History in the last 12 months:  No.  Past Medical History:  Past Medical History:  Diagnosis Date   Anxiety    Delayed developmental milestones    No past surgical history on file.  Family Psychiatric History: None reported  Family History:  Family History  Problem Relation Age of Onset  Rectal cancer Neg Hx    Stomach cancer Neg Hx    Colon polyps Neg Hx     Social History:   Academic/Vocational: Graduated high school in 2017.  Social History   Socioeconomic History   Marital status: Single    Spouse name: Not on file   Number of children: Not on file   Years of education: Not on file   Highest education level: Not on file  Occupational History   Not on file  Tobacco Use   Smoking status: Never   Smokeless tobacco: Never  Substance and Sexual Activity   Alcohol use: Not Currently   Drug use: Not  Currently   Sexual activity: Never  Other Topics Concern   Not on file  Social History Narrative   Not on file   Social Determinants of Health   Financial Resource Strain: Low Risk  (06/16/2022)   Received from [facility], [facility]   Overall Financial Resource Strain (CARDIA)    Difficulty of Paying Living Expenses: Not very hard  Food Insecurity: Food Insecurity Present (06/16/2022)   Received from [facility], [facility]   Hunger Vital Sign    Worried About Running Out of Food in the Last Year: Sometimes true    Ran Out of Food in the Last Year: Sometimes true  Transportation Needs: Unmet Transportation Needs (06/16/2022)   Received from [facility], [facility]   PRAPARE - Transportation    Lack of Transportation (Medical): Yes    Lack of Transportation (Non-Medical): Yes  Physical Activity: Insufficiently Active (06/16/2022)   Received from [facility], [facility]   Exercise Vital Sign    Days of Exercise per Week: 3 days    Minutes of Exercise per Session: 30 min  Stress: Stress Concern Present (06/16/2022)   Received from [facility], [facility]   [employer] of Occupational Health - Occupational Stress Questionnaire    Feeling of Stress : To some extent  Social Connections: Somewhat Isolated (06/16/2022)   Received from [facility], [facility]   Social Network    How would you rate your social network (family, work, friends)?: Restricted participation with some degree of social isolation    Additional Social History: updated  Allergies:   Allergies  Allergen Reactions   Claritin [Loratadine]     Dry eyes   Milk (Cow)     Acid reflux    Current Medications: Current Outpatient Medications  Medication Sig Dispense Refill   albuterol (VENTOLIN HFA) 108 (90 Base) MCG/ACT inhaler Inhale 1-2 puffs into the lungs every 6 (six) hours as needed for wheezing or shortness of breath.     DEXILANT 60 MG capsule TAKE 1 CAPSULE(60 MG) BY  MOUTH DAILY 30 capsule 2   Norethin-Eth Estradiol-Fe (WYMZYA FE) 0.4-35 MG-MCG tablet Chew 1 tablet by mouth at bedtime as needed.     rizatriptan (MAXALT) 10 MG tablet Take 10 mg by mouth as needed for migraine.     sertraline (ZOLOFT) 100 MG tablet Take 100 mg by mouth daily.     topiramate (TOPAMAX) 25 MG tablet Take 25 mg by mouth daily.     cetirizine (ZYRTEC) 10 MG chewable tablet Chew 10 mg by mouth daily. (Patient not taking: Reported on 04/29/2022)     VYFEMLA 0.4-35 MG-MCG tablet Take 1 tablet by mouth daily.     Current Facility-Administered Medications  Medication Dose Route Frequency Provider Last Rate Last Admin   0.9 %  sodium chloride infusion  500  mL Intravenous Once Pyrtle, Carie Caddy, MD        ROS: Review of Systems   Objective:  Psychiatric Specialty Exam: There were no vitals taken for this visit.There is no height or weight on file to calculate BMI.  General Appearance: Well Groomed  Eye Contact:  Fair  Speech:  Clear and Coherent and Normal Rate  Volume:  Normal  Mood:  Euthymic  Affect:  Appropriate and Congruent  Thought Content: WDL   Suicidal Thoughts:  No  Homicidal Thoughts:  No  Thought Process:  Goal Directed but concrete  Orientation:  Other:  Not formally assessed    Memory: Immediate;   Fair Recent;   Fair  Judgment:  Good  Insight:  Present  Concentration:  Concentration: Fair and Attention Span: Fair  Recall:  not formally assessed   Fund of Knowledge: Poor  Language: Poor  Psychomotor Activity:  Normal  Akathisia:  No  AIMS (if indicated): not done  Assets:  Desire for Improvement Housing Leisure Time Resilience Social Support Talents/Skills Vocational/Educational  ADL's:  Intact  Cognition: Impaired,  Severe  Sleep:  Good   PE: General: well-appearing; no acute distress  Pulm: no increased work of breathing on room air  Strength & Muscle Tone: within normal limits Neuro: no focal neurological deficits observed  Gait & Station:  normal  Metabolic Disorder Labs: No results found for: "HGBA1C", "MPG" No results found for: "PROLACTIN" No results found for: "CHOL", "TRIG", "HDL", "CHOLHDL", "VLDL", "LDLCALC" No results found for: "TSH"  Therapeutic Level Labs: No results found for: "LITHIUM" No results found for: "CBMZ" No results found for: "VALPROATE"  Screenings:  Flowsheet Row ED from 06/17/2021 in [facility] at [facility] ED from 06/12/2021 in [facility] Department at [facility]  C-SSRS RISK CATEGORY No Risk No Risk       Collaboration of Care: Collaboration of Care: Dr. Josephina Shih  Patient/Guardian was advised Release of Information must be obtained prior to any record release in order to collaborate their care with an outside provider. Patient/Guardian was advised if they have not already done so to contact the registration department to sign all necessary forms in order for [place] to release information regarding their care.   Consent: Patient/Guardian gives verbal consent for treatment and assignment of benefits for services provided during this visit. Patient/Guardian expressed understanding and agreed to proceed.   Lamar Sprinkles, MD 01/06/2023    10:19 AM

## 2023-01-21 DIAGNOSIS — F411 Generalized anxiety disorder: Secondary | ICD-10-CM | POA: Insufficient documentation

## 2023-01-21 DIAGNOSIS — F819 Developmental disorder of scholastic skills, unspecified: Secondary | ICD-10-CM | POA: Insufficient documentation

## 2023-02-09 ENCOUNTER — Telehealth (HOSPITAL_COMMUNITY): Payer: Self-pay | Admitting: Student

## 2023-02-10 ENCOUNTER — Telehealth (HOSPITAL_COMMUNITY): Payer: Self-pay

## 2023-02-10 NOTE — Telephone Encounter (Signed)
Reached out to [employer].  They are currently reviewing patient's records; medical records were received.  Advised to reach out if any questions about documentation.  Called mom and informed mom that I had reached out to [facility] and they are currently reviewing.  Mom was appreciative of the call.

## 2023-02-10 NOTE — Telephone Encounter (Signed)
Pt mother called stating that the ROI needs to be faxed to medical records to [facility]. The ROI completed did not include  that information. Pt was advised that she would need to come in and include that information if the ROI has not gone out in the mail yet or complete a new one if it has. Confirmed that the ROI was sent out so pt mother was asked to come in and complete a new form.

## 2023-03-28 IMAGING — DX DG CHEST 2V
2 series · 2 of 2 positions shown · non-contrast
Comparison: 06/12/2021

CLINICAL DATA: Chest pain

EXAM:
CHEST - 2 VIEW

[chest pa]
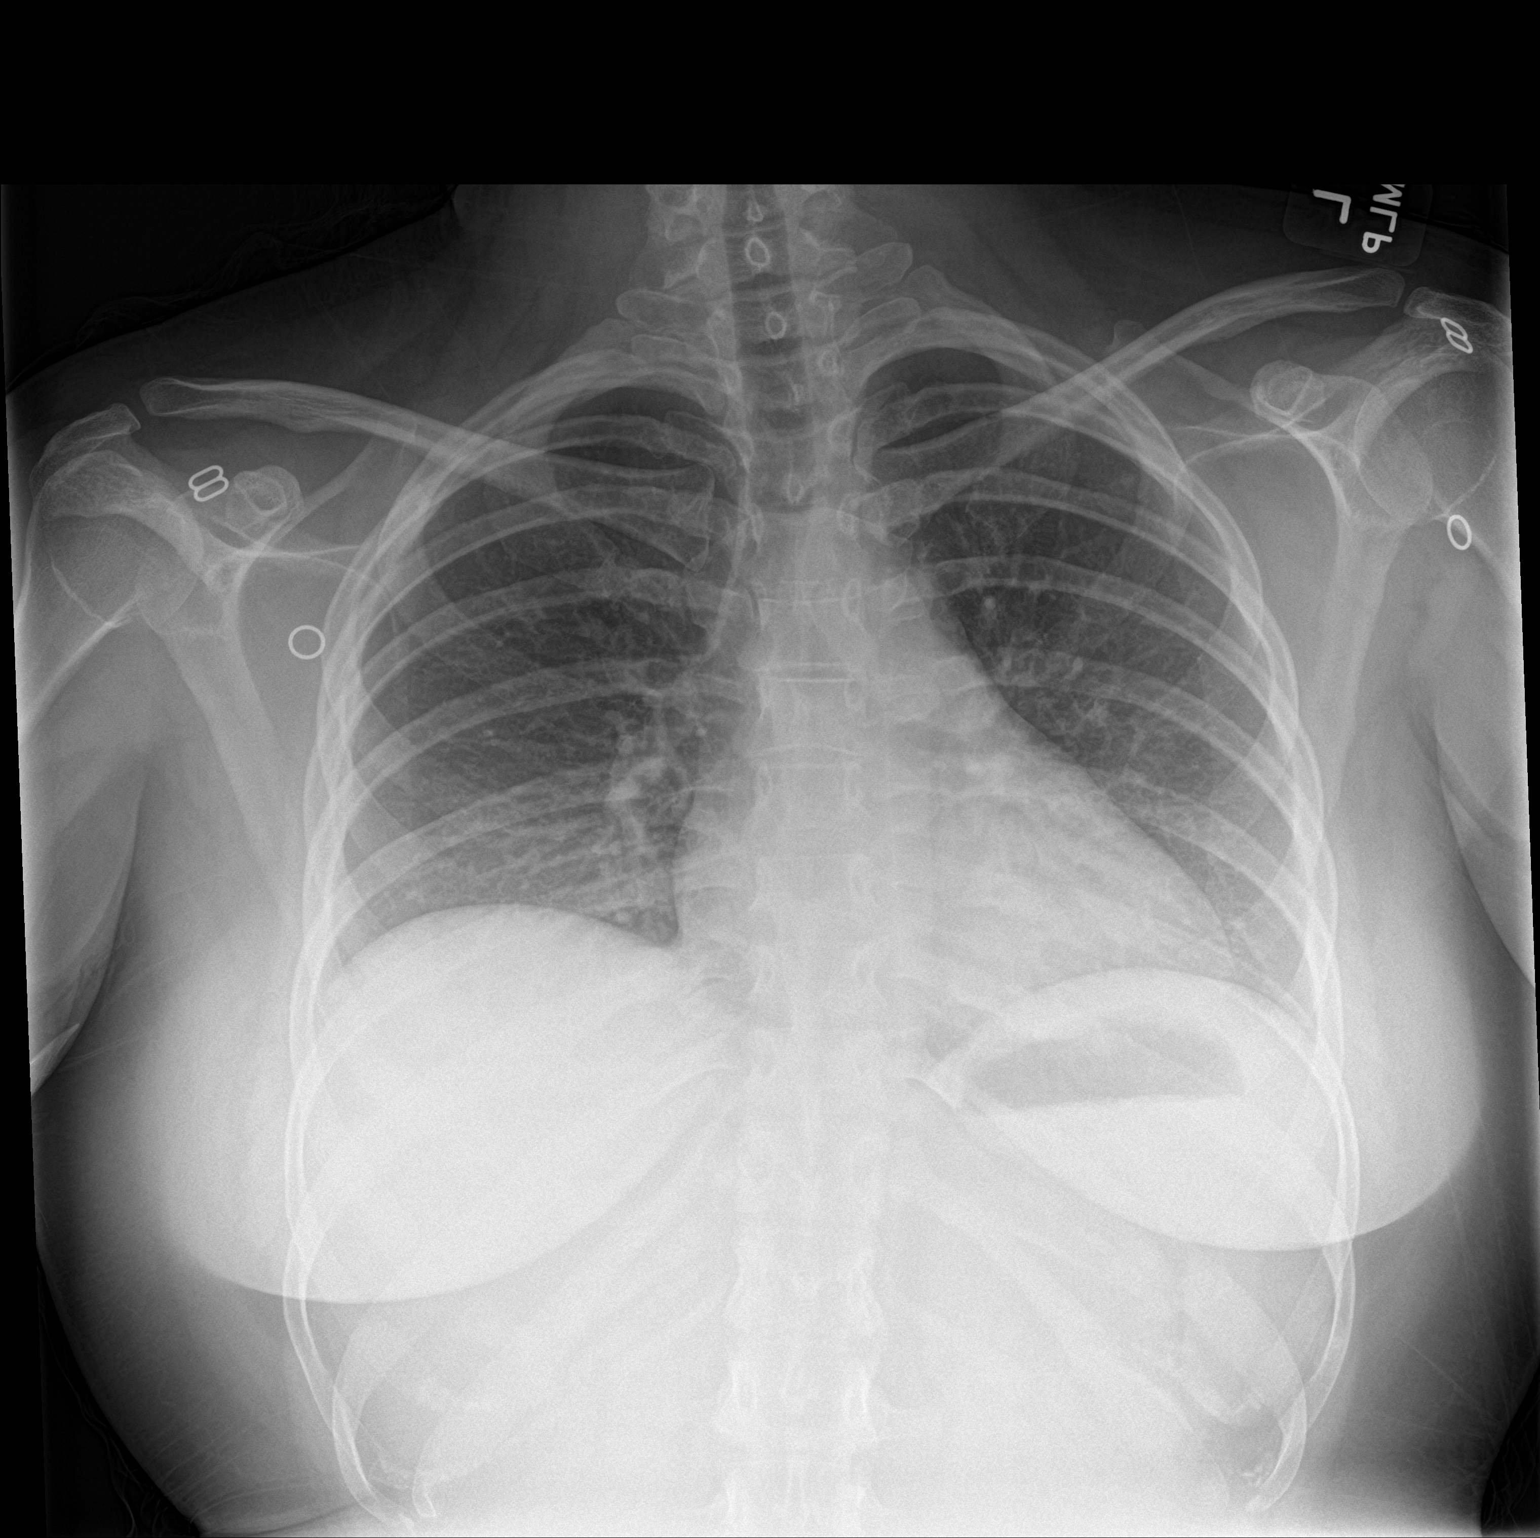

[chest lat]
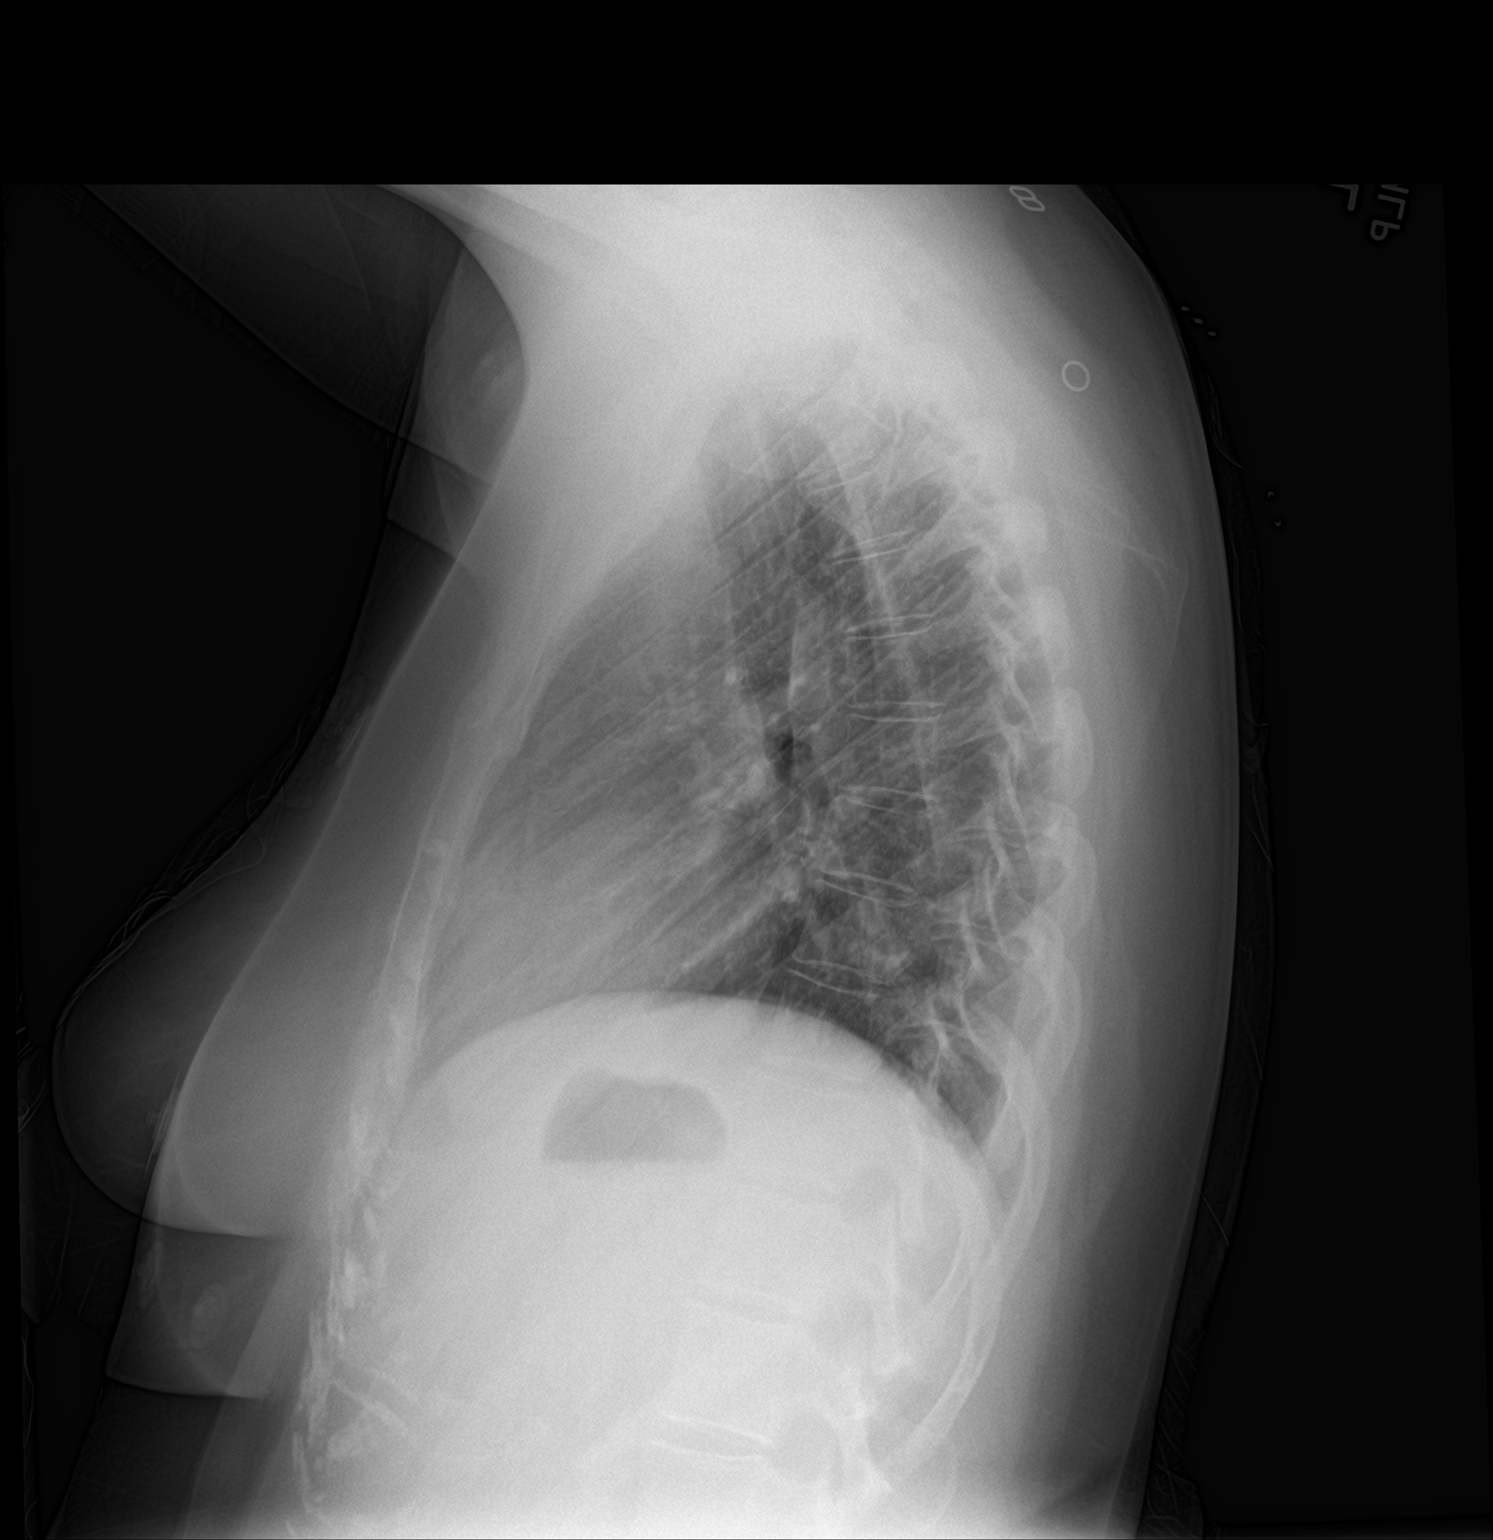

[2 of 2 positions shown; findings below may reference images not displayed]

FINDINGS: The heart size and mediastinal contours are within normal limits.
Both lungs are clear. The visualized skeletal structures are
unremarkable.
IMPRESSION: No active cardiopulmonary disease.
